# Patient Record
Sex: Female | Born: 1965 | Race: White | Hispanic: No | Marital: Married | Smoking: Former smoker
Health system: Southern US, Community
[De-identification: ages and names within clinical notes are randomized; demographics above are authoritative.]

## PROBLEM LIST (undated history)

## (undated) DIAGNOSIS — G43909 Migraine, unspecified, not intractable, without status migrainosus: Secondary | ICD-10-CM

## (undated) DIAGNOSIS — J45909 Unspecified asthma, uncomplicated: Secondary | ICD-10-CM

## (undated) DIAGNOSIS — R51 Headache: Secondary | ICD-10-CM

## (undated) DIAGNOSIS — E785 Hyperlipidemia, unspecified: Secondary | ICD-10-CM

## (undated) DIAGNOSIS — N390 Urinary tract infection, site not specified: Secondary | ICD-10-CM

## (undated) DIAGNOSIS — D649 Anemia, unspecified: Secondary | ICD-10-CM

## (undated) HISTORY — DX: Migraine, unspecified, not intractable, without status migrainosus: G43.909

## (undated) HISTORY — PX: TONSILLECTOMY: SUR1361

## (undated) HISTORY — DX: Anemia, unspecified: D64.9

---

## 1999-01-07 ENCOUNTER — Other Ambulatory Visit: Admission: RE | Admit: 1999-01-07 | Discharge: 1999-01-07 | Payer: Self-pay | Admitting: Obstetrics and Gynecology

## 1999-08-18 ENCOUNTER — Emergency Department (HOSPITAL_COMMUNITY): Admission: EM | Admit: 1999-08-18 | Discharge: 1999-08-19 | Payer: Self-pay | Admitting: Emergency Medicine

## 1999-11-24 ENCOUNTER — Other Ambulatory Visit: Admission: RE | Admit: 1999-11-24 | Discharge: 1999-11-24 | Payer: Self-pay | Admitting: *Deleted

## 2001-02-01 ENCOUNTER — Other Ambulatory Visit: Admission: RE | Admit: 2001-02-01 | Discharge: 2001-02-01 | Payer: Self-pay | Admitting: *Deleted

## 2001-08-17 ENCOUNTER — Encounter: Payer: Self-pay | Admitting: Obstetrics and Gynecology

## 2001-08-17 ENCOUNTER — Ambulatory Visit (HOSPITAL_COMMUNITY): Admission: RE | Admit: 2001-08-17 | Discharge: 2001-08-17 | Payer: Self-pay | Admitting: Obstetrics and Gynecology

## 2001-11-16 ENCOUNTER — Other Ambulatory Visit: Admission: RE | Admit: 2001-11-16 | Discharge: 2001-11-16 | Payer: Self-pay | Admitting: Gynecology

## 2001-11-27 ENCOUNTER — Inpatient Hospital Stay (HOSPITAL_COMMUNITY): Admission: AD | Admit: 2001-11-27 | Discharge: 2001-11-27 | Payer: Self-pay | Admitting: Family Medicine

## 2002-12-12 ENCOUNTER — Other Ambulatory Visit: Admission: RE | Admit: 2002-12-12 | Discharge: 2002-12-12 | Payer: Self-pay | Admitting: Gynecology

## 2003-06-20 ENCOUNTER — Encounter: Admission: RE | Admit: 2003-06-20 | Discharge: 2003-06-20 | Payer: Self-pay | Admitting: Gynecology

## 2003-12-13 ENCOUNTER — Other Ambulatory Visit: Admission: RE | Admit: 2003-12-13 | Discharge: 2003-12-13 | Payer: Self-pay | Admitting: Gynecology

## 2004-06-20 ENCOUNTER — Encounter: Admission: RE | Admit: 2004-06-20 | Discharge: 2004-06-20 | Payer: Self-pay | Admitting: Gynecology

## 2004-12-29 ENCOUNTER — Other Ambulatory Visit: Admission: RE | Admit: 2004-12-29 | Discharge: 2004-12-29 | Payer: Self-pay | Admitting: Gynecology

## 2005-07-16 ENCOUNTER — Encounter: Admission: RE | Admit: 2005-07-16 | Discharge: 2005-07-16 | Payer: Self-pay | Admitting: Gynecology

## 2006-01-05 ENCOUNTER — Other Ambulatory Visit: Admission: RE | Admit: 2006-01-05 | Discharge: 2006-01-05 | Payer: Self-pay | Admitting: Gynecology

## 2006-07-20 ENCOUNTER — Encounter: Admission: RE | Admit: 2006-07-20 | Discharge: 2006-07-20 | Payer: Self-pay | Admitting: Gynecology

## 2007-05-17 ENCOUNTER — Other Ambulatory Visit: Admission: RE | Admit: 2007-05-17 | Discharge: 2007-05-17 | Payer: Self-pay | Admitting: Gynecology

## 2007-07-21 ENCOUNTER — Encounter: Admission: RE | Admit: 2007-07-21 | Discharge: 2007-07-21 | Payer: Self-pay | Admitting: Gynecology

## 2008-07-24 ENCOUNTER — Encounter: Admission: RE | Admit: 2008-07-24 | Discharge: 2008-07-24 | Payer: Self-pay | Admitting: Family Medicine

## 2009-07-31 ENCOUNTER — Ambulatory Visit (HOSPITAL_COMMUNITY): Admission: RE | Admit: 2009-07-31 | Discharge: 2009-07-31 | Payer: Self-pay | Admitting: Family Medicine

## 2010-07-10 ENCOUNTER — Other Ambulatory Visit (HOSPITAL_COMMUNITY): Payer: Self-pay | Admitting: Family Medicine

## 2010-07-10 ENCOUNTER — Other Ambulatory Visit: Payer: Self-pay | Admitting: Family Medicine

## 2010-07-10 DIAGNOSIS — Z1231 Encounter for screening mammogram for malignant neoplasm of breast: Secondary | ICD-10-CM

## 2010-07-24 ENCOUNTER — Other Ambulatory Visit (HOSPITAL_COMMUNITY): Payer: Self-pay | Admitting: Family Medicine

## 2010-07-24 DIAGNOSIS — Z1231 Encounter for screening mammogram for malignant neoplasm of breast: Secondary | ICD-10-CM

## 2010-08-13 ENCOUNTER — Ambulatory Visit (HOSPITAL_COMMUNITY)
Admission: RE | Admit: 2010-08-13 | Discharge: 2010-08-13 | Disposition: A | Discharge status: Home / Self Care | Payer: 59 | Source: Ambulatory Visit | Attending: Family Medicine | Admitting: Family Medicine

## 2010-08-13 DIAGNOSIS — Z1231 Encounter for screening mammogram for malignant neoplasm of breast: Secondary | ICD-10-CM | POA: Insufficient documentation

## 2011-07-21 ENCOUNTER — Other Ambulatory Visit (HOSPITAL_COMMUNITY): Payer: Self-pay | Admitting: Family Medicine

## 2011-07-21 DIAGNOSIS — Z1231 Encounter for screening mammogram for malignant neoplasm of breast: Secondary | ICD-10-CM

## 2011-09-16 ENCOUNTER — Ambulatory Visit (HOSPITAL_COMMUNITY)
Admission: RE | Admit: 2011-09-16 | Discharge: 2011-09-16 | Disposition: A | Discharge status: Home / Self Care | Payer: 59 | Source: Ambulatory Visit | Attending: Family Medicine | Admitting: Family Medicine

## 2011-09-16 DIAGNOSIS — Z1231 Encounter for screening mammogram for malignant neoplasm of breast: Secondary | ICD-10-CM | POA: Insufficient documentation

## 2012-04-02 ENCOUNTER — Inpatient Hospital Stay (HOSPITAL_COMMUNITY)
Admission: AD | Admit: 2012-04-02 | Discharge: 2012-04-02 | Disposition: A | Discharge status: Home / Self Care | Payer: 59 | Source: Ambulatory Visit | Attending: Obstetrics & Gynecology | Admitting: Obstetrics & Gynecology

## 2012-04-02 ENCOUNTER — Encounter (HOSPITAL_COMMUNITY): Payer: Self-pay | Admitting: *Deleted

## 2012-04-02 DIAGNOSIS — N390 Urinary tract infection, site not specified: Secondary | ICD-10-CM | POA: Insufficient documentation

## 2012-04-02 DIAGNOSIS — R350 Frequency of micturition: Secondary | ICD-10-CM | POA: Insufficient documentation

## 2012-04-02 HISTORY — DX: Hyperlipidemia, unspecified: E78.5

## 2012-04-02 HISTORY — DX: Headache: R51

## 2012-04-02 HISTORY — DX: Urinary tract infection, site not specified: N39.0

## 2012-04-02 LAB — URINALYSIS, ROUTINE W REFLEX MICROSCOPIC
Nitrite: POSITIVE — AB
Specific Gravity, Urine: >1.03 — ABNORMAL HIGH (ref 1.005–1.030)
Urobilinogen, UA: 2 mg/dL — ABNORMAL HIGH (ref 0.0–1.0)

## 2012-04-02 LAB — URINE MICROSCOPIC-ADD ON

## 2012-04-02 MED ORDER — SULFAMETHOXAZOLE-TMP DS 800-160 MG PO TABS: 1.0000 | ORAL_TABLET | Freq: Two times a day (BID) | ORAL | Status: DC

## 2012-04-02 MED ORDER — PHENAZOPYRIDINE HCL 200 MG PO TABS: 200.0000 mg | ORAL_TABLET | Freq: Three times a day (TID) | ORAL | Status: DC | PRN

## 2012-04-02 NOTE — MAU Note (Signed)
Pt reprots she  Is having urine frequency and burning since yesterday. Took some pyruidium and it helped a little with pain.

## 2012-04-02 NOTE — MAU Provider Note (Signed)
History     CSN: [id number]  Arrival date and time: 04/02/12 0749   None     Chief Complaint  Patient presents with  . Urinary Tract Infection   HPI 46 y.o. female with urinary frequency and burning since yesterday. Pyridium helped some with pain.    Past Medical History  Diagnosis Date  . Frequent UTI   . Headache   . Hyperlipidemia, acquired     Past Surgical History  Procedure Date  . Tonsillectomy     No family history on file.  History  Substance Use Topics  . Smoking status: Former Smoker    Quit date: 04/02/2008  . Smokeless tobacco: Not on file  . Alcohol Use: Yes     Comment: moderate    Allergies: No Known Allergies  Prescriptions prior to admission  Medication Sig Dispense Refill  . cholecalciferol (VITAMIN D) 1000 UNITS tablet Take 1,000 Units by mouth daily.      Marland Kitchen levocetirizine (XYZAL) 5 MG tablet Take 5 mg by mouth every evening.      . metoprolol succinate (TOPROL-XL) 50 MG 24 hr tablet Take 50 mg by mouth daily. Take with or immediately following a meal.      . Multiple Vitamins-Minerals (MULTIVITAMIN WITH MINERALS) tablet Take 1 tablet by mouth daily.      . rosuvastatin (CRESTOR) 10 MG tablet Take 10 mg by mouth daily.      . sertraline (ZOLOFT) 100 MG tablet Take 100 mg by mouth daily.      . SUMAtriptan (IMITREX) 100 MG tablet Take 100 mg by mouth every 2 (two) hours as needed. migraines        Review of Systems  Constitutional: Negative.   Respiratory: Negative.   Cardiovascular: Negative.   Gastrointestinal: Positive for abdominal pain. Negative for nausea, vomiting, diarrhea and constipation.  Genitourinary: Positive for dysuria, urgency and frequency. Negative for hematuria and flank pain.       Negative for vaginal bleeding, vaginal discharge, dyspareunia  Musculoskeletal: Negative.   Neurological: Negative.   Psychiatric/Behavioral: Negative.    Physical Exam   Last menstrual period 03/17/2012.  Physical Exam  Nursing  note and vitals reviewed. Constitutional: She is oriented to person, place, and time. She appears well-developed and well-nourished. No distress.  Cardiovascular: Normal rate.   Respiratory: Effort normal.  GI: There is CVA tenderness.  Musculoskeletal: Normal range of motion.  Neurological: She is alert and oriented to person, place, and time.  Skin: Skin is warm and dry.  Psychiatric: She has a normal mood and affect.    MAU Course  Procedures  Results for orders placed during the hospital encounter of 04/02/12 (from the past 24 hour(s))  URINALYSIS, ROUTINE W REFLEX MICROSCOPIC     Status: Abnormal   Collection Time   04/02/12  8:20 AM      Component Value Range   Color, Urine ORANGE (*) YELLOW   APPearance HAZY (*) CLEAR   Specific Gravity, Urine >1.030 (*) 1.005 - 1.030   pH 5.5  5.0 - 8.0   Glucose, UA 100 (*) NEGATIVE mg/dL   Hgb urine dipstick LARGE (*) NEGATIVE   Bilirubin Urine SMALL (*) NEGATIVE   Ketones, ur NEGATIVE  NEGATIVE mg/dL   Protein, ur 30 (*) NEGATIVE mg/dL   Urobilinogen, UA 2.0 (*) 0.0 - 1.0 mg/dL   Nitrite POSITIVE (*) NEGATIVE   Leukocytes, UA TRACE (*) NEGATIVE  URINE MICROSCOPIC-ADD ON     Status: Abnormal   Collection Time  04/02/12  8:20 AM      Component Value Range   Squamous Epithelial / LPF FEW (*) RARE   WBC, UA 3-6  <3 WBC/hpf   RBC / HPF 7-10  <3 RBC/hpf   Bacteria, UA FEW (*) RARE   Crystals CA OXALATE CRYSTALS (*) NEGATIVE   Urine-Other MUCOUS PRESENT       Assessment and Plan   1. UTI (lower urinary tract infection)       Medication List     As of 04/02/2012  9:05 AM    START taking these medications         phenazopyridine 200 MG tablet   Commonly known as: PYRIDIUM   Take 1 tablet (200 mg total) by mouth 3 (three) times daily as needed for pain.      sulfamethoxazole-trimethoprim 800-160 MG per tablet   Commonly known as: BACTRIM DS   Take 1 tablet by mouth 2 (two) times daily.      CONTINUE taking these  medications         cholecalciferol 1000 UNITS tablet   Commonly known as: VITAMIN D      levocetirizine 5 MG tablet   Commonly known as: XYZAL      metoprolol succinate 50 MG 24 hr tablet   Commonly known as: TOPROL-XL      multivitamin with minerals tablet      rosuvastatin 10 MG tablet   Commonly known as: CRESTOR      sertraline 100 MG tablet   Commonly known as: ZOLOFT      SUMAtriptan 100 MG tablet   Commonly known as: IMITREX          Where to get your medications    These are the prescriptions that you need to pick up.   You may get these medications from any pharmacy.         phenazopyridine 200 MG tablet   sulfamethoxazole-trimethoprim 800-160 MG per tablet            Follow-up Information    Follow up with your own doctor. (As needed)           Fenris Cauble 04/02/2012, 9:05 AM

## 2012-04-03 LAB — URINE CULTURE: Colony Count: 10000

## 2012-06-06 ENCOUNTER — Inpatient Hospital Stay (HOSPITAL_COMMUNITY)
Admission: AD | Admit: 2012-06-06 | Discharge: 2012-06-06 | Disposition: A | Discharge status: Home / Self Care | Payer: 59 | Source: Ambulatory Visit | Attending: Obstetrics & Gynecology | Admitting: Obstetrics & Gynecology

## 2012-06-06 ENCOUNTER — Encounter (HOSPITAL_COMMUNITY): Payer: Self-pay

## 2012-06-06 DIAGNOSIS — N390 Urinary tract infection, site not specified: Secondary | ICD-10-CM

## 2012-06-06 DIAGNOSIS — R300 Dysuria: Secondary | ICD-10-CM | POA: Insufficient documentation

## 2012-06-06 LAB — URINE MICROSCOPIC-ADD ON

## 2012-06-06 LAB — URINALYSIS, ROUTINE W REFLEX MICROSCOPIC
Glucose, UA: 100 mg/dL — AB
Ketones, ur: NEGATIVE mg/dL
Protein, ur: NEGATIVE mg/dL

## 2012-06-06 MED ORDER — PHENAZOPYRIDINE HCL 200 MG PO TABS: 200.0000 mg | ORAL_TABLET | Freq: Three times a day (TID) | ORAL | Status: DC

## 2012-06-06 MED ORDER — CIPROFLOXACIN HCL 500 MG PO TABS: 500.0000 mg | ORAL_TABLET | Freq: Two times a day (BID) | ORAL | Status: DC

## 2012-06-06 NOTE — MAU Provider Note (Signed)
Attestation of Attending Supervision of Advanced Practitioner (CNM/NP): Evaluation and management procedures were performed by the Advanced Practitioner under my supervision and collaboration. I have reviewed the Advanced Practitioner's note and chart, and I agree with the management and plan.  Elaynah Virginia H. 4:25 PM   

## 2012-06-06 NOTE — MAU Provider Note (Signed)
History     CSN: [id number]  Arrival date and time: 06/06/12 7829   First Provider Initiated Contact with Patient 06/06/12 [phone]      Chief Complaint  Patient presents with  . Dysuria   HPI Ms. Susan Wagner is a 47 y.o. G0 complaining of dysuria. The patient states that she started to experience pressure and felt like she may void earlier this morning while at work. She has had 3 UTIs since April 2013. The patient denies fever. She is slightly nauseous now, but no vomiting.   OB History    Grav Para Term Preterm Abortions TAB SAB Ect Mult Living   0         0      Past Medical History  Diagnosis Date  . Frequent UTI   . Headache   . Hyperlipidemia, acquired     Past Surgical History  Procedure Date  . Tonsillectomy     No family history on file.  History  Substance Use Topics  . Smoking status: Former Smoker    Quit date: 04/02/2008  . Smokeless tobacco: Not on file  . Alcohol Use: Yes     Comment: moderate    Allergies: No Known Allergies  Prescriptions prior to admission  Medication Sig Dispense Refill  . ergocalciferol (VITAMIN D2) 50000 UNITS capsule Take 50,000 Units by mouth once a week.      . levocetirizine (XYZAL) 5 MG tablet Take 5 mg by mouth every evening.      . metoprolol succinate (TOPROL-XL) 50 MG 24 hr tablet Take 50 mg by mouth daily. Take with or immediately following a meal.      . Multiple Vitamins-Minerals (MULTIVITAMIN WITH MINERALS) tablet Take 1 tablet by mouth daily.      . phenazopyridine (PYRIDIUM) 200 MG tablet Take 1 tablet (200 mg total) by mouth 3 (three) times daily as needed for pain.  10 tablet  0  . rosuvastatin (CRESTOR) 10 MG tablet Take 10 mg by mouth daily.      . sertraline (ZOLOFT) 100 MG tablet Take 100 mg by mouth daily.        ROS All negative unless otherwise noted in HPI Physical Exam   Blood pressure 124/74, pulse 86, temperature 98.3 F (36.8 C), temperature source Oral, resp. rate 16, last menstrual  period 05/04/2012, SpO2 100.00%.  Physical Exam  Constitutional: She is oriented to person, place, and time. She appears well-developed and well-nourished. No distress.  HENT:  Head: Normocephalic.  Cardiovascular: Normal rate, regular rhythm and normal heart sounds.   Respiratory: Effort normal and breath sounds normal. No respiratory distress.  GI: Soft. She exhibits no distension and no mass. There is no tenderness. There is no rebound and no guarding.  Neurological: She is alert and oriented to person, place, and time.  Skin: Skin is warm and dry. No erythema.  Psychiatric: She has a normal mood and affect.   Results for orders placed during the hospital encounter of 06/06/12 (from the past 24 hour(s))  URINALYSIS, ROUTINE W REFLEX MICROSCOPIC     Status: Abnormal   Collection Time   06/06/12  8:50 AM      Component Value Range   Color, Urine AMBER (*) YELLOW   APPearance HAZY (*) CLEAR   Specific Gravity, Urine >1.030 (*) 1.005 - 1.030   pH 5.0  5.0 - 8.0   Glucose, UA 100 (*) NEGATIVE mg/dL   Hgb urine dipstick MODERATE (*) NEGATIVE   Bilirubin  Urine NEGATIVE  NEGATIVE   Ketones, ur NEGATIVE  NEGATIVE mg/dL   Protein, ur NEGATIVE  NEGATIVE mg/dL   Urobilinogen, UA 1.0  0.0 - 1.0 mg/dL   Nitrite POSITIVE (*) NEGATIVE   Leukocytes, UA SMALL (*) NEGATIVE  URINE MICROSCOPIC-ADD ON     Status: Abnormal   Collection Time   06/06/12  8:50 AM      Component Value Range   Squamous Epithelial / LPF FEW (*) RARE   WBC, UA 11-20  <3 WBC/hpf   RBC / HPF 3-6  <3 RBC/hpf   Bacteria, UA FEW (*) RARE   Urine-Other MUCOUS PRESENT    POCT PREGNANCY, URINE     Status: Normal   Collection Time   06/06/12  9:09 AM      Component Value Range   Preg Test, Ur NEGATIVE  NEGATIVE    MAU Course  Procedures None  MDM UA sent  Assessment and Plan  A: UTI  P: Discharge home Rx for cipro and pyridium sent to patient's pharmacy Encouraged adequate hydration Patient should follow-up  with PCP and discuss need for possible urology referral Patient may return to MAU as needed   Freddi Starr, PA-C 06/06/2012, 9:58 AM

## 2012-06-06 NOTE — MAU Note (Signed)
Patient state she has a history or recurrent UTI's. Started having urinary urgency and pressure during the night.

## 2012-06-08 LAB — URINE CULTURE

## 2012-06-30 ENCOUNTER — Other Ambulatory Visit (HOSPITAL_COMMUNITY): Payer: Self-pay | Admitting: Family Medicine

## 2012-09-16 ENCOUNTER — Ambulatory Visit (HOSPITAL_COMMUNITY)
Admission: RE | Admit: 2012-09-16 | Discharge: 2012-09-16 | Disposition: A | Discharge status: Home / Self Care | Payer: 59 | Source: Ambulatory Visit | Attending: Family Medicine | Admitting: Family Medicine

## 2012-09-16 DIAGNOSIS — Z1231 Encounter for screening mammogram for malignant neoplasm of breast: Secondary | ICD-10-CM | POA: Insufficient documentation

## 2012-11-22 ENCOUNTER — Encounter: Payer: Self-pay | Admitting: Obstetrics and Gynecology

## 2012-11-23 ENCOUNTER — Ambulatory Visit: Payer: Self-pay | Admitting: Obstetrics and Gynecology

## 2012-11-23 ENCOUNTER — Encounter: Payer: Self-pay | Admitting: Obstetrics and Gynecology

## 2012-11-23 DIAGNOSIS — Z01419 Encounter for gynecological examination (general) (routine) without abnormal findings: Secondary | ICD-10-CM

## 2013-03-16 ENCOUNTER — Other Ambulatory Visit: Payer: Self-pay

## 2013-07-27 ENCOUNTER — Ambulatory Visit: Payer: Self-pay | Admitting: Women's Health

## 2013-08-15 ENCOUNTER — Other Ambulatory Visit (HOSPITAL_COMMUNITY): Payer: Self-pay | Admitting: Family Medicine

## 2013-08-15 DIAGNOSIS — Z1231 Encounter for screening mammogram for malignant neoplasm of breast: Secondary | ICD-10-CM

## 2013-08-25 ENCOUNTER — Ambulatory Visit: Payer: Self-pay | Admitting: Women's Health

## 2013-09-05 ENCOUNTER — Ambulatory Visit (INDEPENDENT_AMBULATORY_CARE_PROVIDER_SITE_OTHER): Payer: 59 | Admitting: Women's Health

## 2013-09-05 ENCOUNTER — Encounter: Payer: Self-pay | Admitting: Women's Health

## 2013-09-05 ENCOUNTER — Other Ambulatory Visit (HOSPITAL_COMMUNITY)
Admission: RE | Admit: 2013-09-05 | Discharge: 2013-09-05 | Disposition: A | Discharge status: Home / Self Care | Payer: 59 | Source: Ambulatory Visit | Attending: Women's Health | Admitting: Women's Health

## 2013-09-05 VITALS — BP 110/78 | Ht 64.0 in | Wt 176.0 lb

## 2013-09-05 DIAGNOSIS — Z1151 Encounter for screening for human papillomavirus (HPV): Secondary | ICD-10-CM | POA: Insufficient documentation

## 2013-09-05 DIAGNOSIS — N979 Female infertility, unspecified: Secondary | ICD-10-CM | POA: Insufficient documentation

## 2013-09-05 DIAGNOSIS — N920 Excessive and frequent menstruation with regular cycle: Secondary | ICD-10-CM

## 2013-09-05 DIAGNOSIS — Z01419 Encounter for gynecological examination (general) (routine) without abnormal findings: Secondary | ICD-10-CM | POA: Insufficient documentation

## 2013-09-05 NOTE — Progress Notes (Signed)
Susan Wagner 1966/02/16 [id number]    History:    Presents for new patient annual exam.  Monthly cycle/infertility, cycles lasting up to 2 weeks. Prior patient of Dr Carren Rang. Reports  normal Paps and mammograms. Hypercholesterolemia and migraines managed by  primary care.   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 Sister cervical cancer.  ROS:  A  ROS was performed and pertinent positives and negatives are included.  Exam:  Filed Vitals:   09/05/13 1519  BP: 110/78    General appearance:  Normal Thyroid:  Symmetrical, normal in size, without palpable masses or nodularity. Respiratory  Auscultation:  Clear without wheezing or rhonchi Cardiovascular  Auscultation:  Regular rate, without rubs, murmurs or gallops  Edema/varicosities:  Not grossly evident Abdominal  Soft,nontender, without masses, guarding or rebound.  Liver/spleen:  No organomegaly noted  Hernia:  None appreciated  Skin  Inspection:  Grossly normal   Breasts: Examined lying and sitting.     Right: Without masses, retractions, discharge or axillary adenopathy.     Left: Without masses, retractions, discharge or axillary adenopathy. Gentitourinary   Inguinal/mons:  Normal without inguinal adenopathy  External genitalia:  Normal  BUS/Urethra/Skene's glands:  Normal  Vagina:  Normal  Cervix:  Normal  Uterus:  normal in size, shape and contour.  Midline and mobile  Adnexa/parametria:     Rt: Without masses or tenderness.   Lt: Without masses or tenderness.  Anus and perineum: Normal  Digital rectal exam: Normal sphincter tone without palpated masses or tenderness  Assessment/Plan:  47 y.o.MWF G0  for annual exam.   Primary infertility Menorrhagia Hypercholesteremia and migraines -  meds and labs primary care  Plan: Sonohysterogram with Dr. Phineas Real after next cycle.  Options reviewed, ablation. SBE's, continue annual mammogram, increase regular exercise and decrease calories for weight loss, vitamin D 2000 daily encouraged. TSH, prolactin, UA, with HR HPV typing.   [place], 5:35 PM 09/05/2013

## 2013-09-05 NOTE — Patient Instructions (Signed)
Menorrhagia  Menorrhagia is the medical term for when your menstrual periods are heavy or last longer than usual. With menorrhagia, every period you have may cause enough blood loss and cramping that you are unable to maintain your usual activities.  CAUSES   In some cases, the cause of heavy periods is unknown, but a number of conditions may cause menorrhagia. Common causes include:  · A problem with the hormone-producing thyroid gland (hypothyroid).  · Noncancerous growths in the uterus (polyps or fibroids).  · An imbalance of the estrogen and progesterone hormones.  · One of your ovaries not releasing an egg during one or more months.  · Side effects of having an intrauterine device (IUD).  · Side effects of some medicines, such as anti-inflammatory medicines or blood thinners.  · A bleeding disorder that stops your blood from clotting normally.  SIGNS AND SYMPTOMS   During a normal period, bleeding lasts between 4 and 8 days. Signs that your periods are too heavy include:  · You routinely have to change your pad or tampon every 1 or 2 hours because it is completely soaked.  · You pass blood clots larger than 1 inch (2.5 cm) in size.  · You have bleeding for more than 7 days.  · You need to use pads and tampons at the same time because of heavy bleeding.  · You need to wake up to change your pads or tampons during the night.  · You have symptoms of anemia, such as tiredness, fatigue, or shortness of breath.   DIAGNOSIS   Your health care provider will perform a physical exam and ask you questions about your symptoms and menstrual history. Other tests may be ordered based on what the health care provider finds during the exam. These tests can include:  · Blood tests To check if you are pregnant or have hormonal changes, a bleeding or thyroid disorder, low iron levels (anemia), or other problems.  · Endometrial biopsy Your health care provider takes a sample of tissue from the inside of your uterus to be examined  under a microscope.  · Pelvic ultrasound This test uses sound waves to make a picture of your uterus, ovaries, and vagina. The pictures can show if you have fibroids or other growths.  · Hysteroscopy For this test, your health care provider will use a small telescope to look inside your uterus.  Based on the results of your initial tests, your health care provider may recommend further testing.  TREATMENT   Treatment may not be needed. If it is needed, your health care provider may recommend treatment with one or more medicines first. If these do not reduce bleeding enough, a surgical treatment might be an option. The best treatment for you will depend on:   · Whether you need to prevent pregnancy.    · Your desire to have children in the future.  · The cause and severity of your bleeding.  · Your opinion and personal preference.    Medicines for menorrhagia may include:  · Birth control methods that use hormones These include the pill, skin patch, vaginal ring, shots that you get every 3 months, hormonal IUD, and implant. These treatments reduce bleeding during your menstrual period.  · Medicines that thicken blood and slow bleeding.  · Medicines that reduce swelling, such as ibuprofen.   · Medicines that contain a synthetic hormone called progestin.    · Medicines that make the ovaries stop working for a short time.    You may need surgical   treatment for menorrhagia if the medicines are unsuccessful. Treatment options include:  · Dilation and curettage (D&C) In this procedure, your health care provider opens (dilates) your cervix and then scrapes or suctions tissue from the lining of your uterus to reduce menstrual bleeding.  · Operative hysteroscopy This procedure uses a tiny tube with a light (hysteroscope) to view your uterine cavity and can help in the surgical removal of a polyp that may be causing heavy periods.  · Endometrial ablation Through various techniques, your health care provider permanently  destroys the entire lining of your uterus (endometrium). After endometrial ablation, most women have little or no menstrual flow. Endometrial ablation reduces your ability to become pregnant.  · Endometrial resection This surgical procedure uses an electrosurgical wire loop to remove the lining of the uterus. This procedure also reduces your ability to become pregnant.  · Hysterectomy Surgical removal of the uterus and cervix is a permanent procedure that stops menstrual periods. Pregnancy is not possible after a hysterectomy. This procedure requires anesthesia and hospitalization.  HOME CARE INSTRUCTIONS   · Only take over-the-counter or prescription medicines as directed by your health care provider. Take prescribed medicines exactly as directed. Do not change or switch medicines without consulting your health care provider.  · Take any prescribed iron pills exactly as directed by your health care provider. Long-term heavy bleeding may result in low iron levels. Iron pills help replace the iron your body lost from heavy bleeding. Iron may cause constipation. If this becomes a problem, increase the bran, fruits, and roughage in your diet.  · Do not take aspirin or medicines that contain aspirin 1 week before or during your menstrual period. Aspirin may make the bleeding worse.  · If you need to change your sanitary pad or tampon more than once every 2 hours, stay in bed and rest as much as possible until the bleeding stops.  · Eat well-balanced meals. Eat foods high in iron. Examples are leafy green vegetables, meat, liver, eggs, and whole grain breads and cereals. Do not try to lose weight until the abnormal bleeding has stopped and your blood iron level is back to normal.  SEEK MEDICAL CARE IF:   · You soak through a pad or tampon every 1 or 2 hours, and this happens every time you have a period.  · You need to use pads and tampons at the same time because you are bleeding so much.  · You need to change your pad  or tampon during the night.  · You have a period that lasts for more than 8 days.  · You pass clots bigger than 1 inch wide.  · You have irregular periods that happen more or less often than once a month.  · You feel dizzy or faint.  · You feel very weak or tired.  · You feel short of breath or feel your heart is beating too fast when you exercise.  · You have nausea and vomiting or diarrhea while you are taking your medicine.  · You have any problems that may be related to the medicine you are taking.  SEEK IMMEDIATE MEDICAL CARE IF:   · You soak through 4 or more pads or tampons in 2 hours.  · You have any bleeding while you are pregnant.  MAKE SURE YOU:   · Understand these instructions.  · Will watch your condition.  · Will get help right away if you are not doing well or get worse.    Document Released: 04/27/2005 Document Revised: 02/15/2013 Document Reviewed: 10/16/2012  [employer]® Patient Information ©2014 [employer].

## 2013-09-06 ENCOUNTER — Encounter: Payer: Self-pay | Admitting: Women's Health

## 2013-09-06 LAB — URINALYSIS W MICROSCOPIC + REFLEX CULTURE
Bilirubin Urine: NEGATIVE
Casts: NONE SEEN
Crystals: NONE SEEN
Glucose, UA: NEGATIVE mg/dL
KETONES UR: NEGATIVE mg/dL
Nitrite: NEGATIVE
PROTEIN: NEGATIVE mg/dL
Specific Gravity, Urine: 1.025 (ref 1.005–1.030)
UROBILINOGEN UA: 0.2 mg/dL (ref 0.0–1.0)
WBC, UA: >50 WBC/hpf — AB (ref <3)
pH: 5.5 (ref 5.0–8.0)

## 2013-09-06 LAB — TSH: TSH: 1.748 u[IU]/mL (ref 0.350–4.500)

## 2013-09-06 LAB — PROLACTIN: Prolactin: 12.7 ng/mL

## 2013-09-08 ENCOUNTER — Other Ambulatory Visit: Payer: Self-pay | Admitting: Women's Health

## 2013-09-08 DIAGNOSIS — N390 Urinary tract infection, site not specified: Secondary | ICD-10-CM

## 2013-09-08 LAB — URINE CULTURE: Colony Count: >=100000

## 2013-09-26 ENCOUNTER — Other Ambulatory Visit: Payer: 59

## 2013-09-26 ENCOUNTER — Ambulatory Visit (HOSPITAL_COMMUNITY)
Admission: RE | Admit: 2013-09-26 | Discharge: 2013-09-26 | Disposition: A | Discharge status: Home / Self Care | Payer: 59 | Source: Ambulatory Visit | Attending: Family Medicine | Admitting: Family Medicine

## 2013-09-26 DIAGNOSIS — Z1231 Encounter for screening mammogram for malignant neoplasm of breast: Secondary | ICD-10-CM | POA: Insufficient documentation

## 2013-09-26 DIAGNOSIS — N390 Urinary tract infection, site not specified: Secondary | ICD-10-CM

## 2013-09-27 ENCOUNTER — Encounter: Payer: Self-pay | Admitting: Women's Health

## 2013-09-27 LAB — URINALYSIS W MICROSCOPIC + REFLEX CULTURE
Bilirubin Urine: NEGATIVE
CASTS: NONE SEEN
Glucose, UA: NEGATIVE mg/dL
HGB URINE DIPSTICK: NEGATIVE
KETONES UR: NEGATIVE mg/dL
Leukocytes, UA: NEGATIVE
Nitrite: NEGATIVE
PH: 5.5 (ref 5.0–8.0)
Protein, ur: NEGATIVE mg/dL
Specific Gravity, Urine: 1.017 (ref 1.005–1.030)
UROBILINOGEN UA: 0.2 mg/dL (ref 0.0–1.0)

## 2014-08-28 ENCOUNTER — Other Ambulatory Visit (HOSPITAL_COMMUNITY): Payer: Self-pay | Admitting: Family Medicine

## 2014-08-28 DIAGNOSIS — Z1231 Encounter for screening mammogram for malignant neoplasm of breast: Secondary | ICD-10-CM

## 2014-10-03 ENCOUNTER — Ambulatory Visit (HOSPITAL_COMMUNITY)
Admission: RE | Admit: 2014-10-03 | Discharge: 2014-10-03 | Disposition: A | Discharge status: Home / Self Care | Payer: 59 | Source: Ambulatory Visit | Attending: Family Medicine | Admitting: Family Medicine

## 2014-10-03 DIAGNOSIS — Z1231 Encounter for screening mammogram for malignant neoplasm of breast: Secondary | ICD-10-CM | POA: Insufficient documentation

## 2014-11-14 ENCOUNTER — Ambulatory Visit (INDEPENDENT_AMBULATORY_CARE_PROVIDER_SITE_OTHER): Payer: 59 | Admitting: Women's Health

## 2014-11-14 ENCOUNTER — Encounter: Payer: Self-pay | Admitting: Women's Health

## 2014-11-14 VITALS — BP 130/82 | Wt 178.0 lb

## 2014-11-14 DIAGNOSIS — N951 Menopausal and female climacteric states: Secondary | ICD-10-CM

## 2014-11-14 DIAGNOSIS — N912 Amenorrhea, unspecified: Secondary | ICD-10-CM | POA: Diagnosis not present

## 2014-11-14 DIAGNOSIS — Z01419 Encounter for gynecological examination (general) (routine) without abnormal findings: Secondary | ICD-10-CM | POA: Diagnosis not present

## 2014-11-14 MED ORDER — VENLAFAXINE HCL ER 75 MG PO CP24
75.0000 mg | ORAL_CAPSULE | Freq: Every day | ORAL | Status: DC
Start: 2014-11-14 — End: 2017-02-16

## 2014-11-14 NOTE — Progress Notes (Signed)
Susan Wagner 1966/04/14 [id number]    History:    Presents for annual exam.  Regular monthly cycle until September 2015. Increased hot flushes difficulty sleeping since. Normal Pap and mammogram history. Primary care manages hypercholesterolemia and migraines. History of infertility.  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  ROS:  A ROS was performed and pertinent positives and negatives are included.  Exam:  Filed Vitals:   11/14/14 1411  BP: 130/82    General appearance:  Normal Thyroid:  Symmetrical, normal in size, without palpable masses or nodularity. Respiratory  Auscultation:  Clear without wheezing or rhonchi Cardiovascular  Auscultation:  Regular rate, without rubs, murmurs or gallops  Edema/varicosities:  Not grossly evident Abdominal  Soft,nontender, without masses, guarding or rebound.  Liver/spleen:  No organomegaly noted  Hernia:  None appreciated  Skin  Inspection:  Grossly normal   Breasts: Examined lying and sitting.     Right: Without masses, retractions, discharge or axillary adenopathy.     Left: Without masses, retractions, discharge or axillary adenopathy. Gentitourinary   Inguinal/mons:  Normal without inguinal adenopathy  External genitalia:  Normal  BUS/Urethra/Skene's glands:  Normal  Vagina:  Normal  Cervix:  Normal  Uterus:   normal in size, shape and contour.  Midline and mobile  Adnexa/parametria:     Rt: Without masses or tenderness.   Lt: Without masses or tenderness.  Anus and perineum: Normal  Digital rectal exam: Normal sphincter tone without palpated masses or tenderness  Assessment/Plan:  49 y.o. MWF G0 for annual exam.    Amenorrhea with hot flashes and difficulty sleeping Hypercholesterolemia-primary care manages labs and meds  Plan: Menopause reviewed, options discussed, HRT,  declines will try Effexor 75 mg daily prescription, proper use given and reviewed will call if no relief of symptoms. SBE's, continue annual 3-D screening mammogram, calcium rich diet, vitamin D 2000 daily -reports normal vitamin D at primary care. Increase exercise and decrease calories for weight loss encouraged. FSH, UA. Pap normal with negative HR HPV 2015, new screening guidelines reviewed. [facility] information given instructed to schedule screening colonoscopy at 72.    Huel Cote [facility], 5:51 PM 11/14/2014

## 2014-11-14 NOTE — Patient Instructions (Signed)
Colonoscopy  [facility] Dr Raquel James or Dr Carlean Purl  Health Recommendations for Postmenopausal Women Respected and ongoing research has looked at the most common causes of death, disability, and poor quality of life in postmenopausal women. The causes include heart disease, diseases of blood vessels, diabetes, depression, cancer, and bone loss (osteoporosis). Many things can be done to help lower the chances of developing these and other common problems. CARDIOVASCULAR DISEASE Heart Disease: A heart attack is a medical emergency. Know the signs and symptoms of a heart attack. Below are things women can do to reduce their risk for heart disease.   Do not smoke. If you smoke, quit.  Aim for a healthy weight. Being overweight causes many preventable deaths. Eat a healthy and balanced diet and drink an adequate amount of liquids.  Get moving. Make a commitment to be more physically active. Aim for 30 minutes of activity on most, if not all days of the week.  Eat for heart health. Choose a diet that is low in saturated fat and cholesterol and eliminate trans fat. Include whole grains, vegetables, and fruits. Read and understand the labels on food containers before buying.  Know your numbers. Ask your caregiver to check your blood pressure, cholesterol (total, HDL, LDL, triglycerides) and blood glucose. Work with your caregiver on improving your entire clinical picture.  High blood pressure. Limit or stop your table salt intake (try salt substitute and food seasonings). Avoid salty foods and drinks. Read labels on food containers before buying. Eating well and exercising can help control high blood pressure. STROKE  Stroke is a medical emergency. Stroke may be the result of a blood clot in a blood vessel in the brain or by a brain hemorrhage (bleeding). Know the signs and symptoms of a stroke. To lower the risk of developing a stroke:  Avoid fatty foods.  Quit smoking.  Control your diabetes, blood  pressure, and irregular heart rate. THROMBOPHLEBITIS (BLOOD CLOT) OF THE LEG  Becoming overweight and leading a stationary lifestyle may also contribute to developing blood clots. Controlling your diet and exercising will help lower the risk of developing blood clots. CANCER SCREENING  Breast Cancer: Take steps to reduce your risk of breast cancer.  You should practice "breast self-awareness." This means understanding the normal appearance and feel of your breasts and should include breast self-examination. Any changes detected, no matter how small, should be reported to your caregiver.  After age 46, you should have a clinical breast exam (CBE) every year.  Starting at age 44, you should consider having a mammogram (breast X-ray) every year.  If you have a family history of breast cancer, talk to your caregiver about genetic screening.  If you are at high risk for breast cancer, talk to your caregiver about having an MRI and a mammogram every year.  Intestinal or Stomach Cancer: Tests to consider are a rectal exam, fecal occult blood, sigmoidoscopy, and colonoscopy. Women who are high risk may need to be screened at an earlier age and more often.  Cervical Cancer:  Beginning at age 72, you should have a Pap test every 3 years as long as the past 3 Pap tests have been normal.  If you have had past treatment for cervical cancer or a condition that could lead to cancer, you need Pap tests and screening for cancer for at least 20 years after your treatment.  If you had a hysterectomy for a problem that was not cancer or a condition that could lead  to cancer, then you no longer need Pap tests.  If you are between ages 44 and 32, and you have had normal Pap tests going back 10 years, you no longer need Pap tests.  If Pap tests have been discontinued, risk factors (such as a new sexual partner) need to be reassessed to determine if screening should be resumed.  Some medical problems can  increase the chance of getting cervical cancer. In these cases, your caregiver may recommend more frequent screening and Pap tests.  Uterine Cancer: If you have vaginal bleeding after reaching menopause, you should notify your caregiver.  Ovarian Cancer: Other than yearly pelvic exams, there are no reliable tests available to screen for ovarian cancer at this time except for yearly pelvic exams.  Lung Cancer: Yearly chest X-rays can detect lung cancer and should be done on high risk women, such as cigarette smokers and women with chronic lung disease (emphysema).  Skin Cancer: A complete body skin exam should be done at your yearly examination. Avoid overexposure to the sun and ultraviolet light lamps. Use a strong sun block cream when in the sun. All of these things are important for lowering the risk of skin cancer. MENOPAUSE Menopause Symptoms: Hormone therapy products are effective for treating symptoms associated with menopause:  Moderate to severe hot flashes.  Night sweats.  Mood swings.  Headaches.  Tiredness.  Loss of sex drive.  Insomnia.  Other symptoms. Hormone replacement carries certain risks, especially in older women. Women who use or are thinking about using estrogen or estrogen with progestin treatments should discuss that with their caregiver. Your caregiver will help you understand the benefits and risks. The ideal dose of hormone replacement therapy is not known. The Food and Drug Administration (FDA) has concluded that hormone therapy should be used only at the lowest doses and for the shortest amount of time to reach treatment goals.  OSTEOPOROSIS Protecting Against Bone Loss and Preventing Fracture If you use hormone therapy for prevention of bone loss (osteoporosis), the risks for bone loss must outweigh the risk of the therapy. Ask your caregiver about other medications known to be safe and effective for preventing bone loss and fractures. To guard against bone  loss or fractures, the following is recommended:  If you are younger than age 71, take 1000 mg of calcium and at least 600 mg of Vitamin D per day.  If you are older than age 66 but younger than age 29, take 1200 mg of calcium and at least 600 mg of Vitamin D per day.  If you are older than age 46, take 1200 mg of calcium and at least 800 mg of Vitamin D per day. Smoking and excessive alcohol intake increases the risk of osteoporosis. Eat foods rich in calcium and vitamin D and do weight bearing exercises several times a week as your caregiver suggests. DIABETES Diabetes Mellitus: If you have type I or type 2 diabetes, you should keep your blood sugar under control with diet, exercise, and recommended medication. Avoid starchy and fatty foods, and too many sweets. Being overweight can make diabetes control more difficult. COGNITION AND MEMORY Cognition and Memory: Menopausal hormone therapy is not recommended for the prevention of cognitive disorders such as Alzheimer's disease or memory loss.  DEPRESSION  Depression may occur at any age, but it is common in elderly women. This may be because of physical, medical, social (loneliness), or financial problems and needs. If you are experiencing depression because of medical problems and  control of symptoms, talk to your caregiver about this. Physical activity and exercise may help with mood and sleep. Community and volunteer involvement may improve your sense of value and worth. If you have depression and you feel that the problem is getting worse or becoming severe, talk to your caregiver about which treatment options are best for you. ACCIDENTS  Accidents are common and can be serious in elderly woman. Prepare your house to prevent accidents. Eliminate throw rugs, place hand bars in bath, shower, and toilet areas. Avoid wearing high heeled shoes or walking on wet, snowy, and icy areas. Limit or stop driving if you have vision or hearing problems, or if  you feel you are unsteady with your movements and reflexes. HEPATITIS C Hepatitis C is a type of viral infection affecting the liver. It is spread mainly through contact with blood from an infected person. It can be treated, but if left untreated, it can lead to severe liver damage over the years. Many people who are infected do not know that the virus is in their blood. If you are a "baby-boomer", it is recommended that you have one screening test for Hepatitis C. IMMUNIZATIONS  Several immunizations are important to consider having during your senior years, including:   Tetanus, diphtheria, and pertussis booster shot.  Influenza every year before the flu season begins.  Pneumonia vaccine.  Shingles vaccine.  Others, as indicated based on your specific needs. Talk to your caregiver about these. Document Released: 06/19/2005 Document Revised: 09/11/2013 Document Reviewed: 02/13/2008 [facility] Patient Information 2015 [place], [place]. This information is not intended to replace advice given to you by your health care provider. Make sure you discuss any questions you have with your health care provider.

## 2014-11-15 LAB — URINALYSIS W MICROSCOPIC + REFLEX CULTURE
Bacteria, UA: NONE SEEN
CASTS: NONE SEEN
Crystals: NONE SEEN
GLUCOSE, UA: NEGATIVE mg/dL
Ketones, ur: NEGATIVE mg/dL
LEUKOCYTES UA: NEGATIVE
Nitrite: NEGATIVE
PH: 5 (ref 5.0–8.0)
Protein, ur: NEGATIVE mg/dL
Specific Gravity, Urine: 1.02 (ref 1.005–1.030)
Urobilinogen, UA: 0.2 mg/dL (ref 0.0–1.0)

## 2014-11-20 ENCOUNTER — Encounter: Payer: Self-pay | Admitting: Women's Health

## 2015-05-17 DIAGNOSIS — J301 Allergic rhinitis due to pollen: Secondary | ICD-10-CM | POA: Diagnosis not present

## 2015-05-17 DIAGNOSIS — J3081 Allergic rhinitis due to animal (cat) (dog) hair and dander: Secondary | ICD-10-CM | POA: Diagnosis not present

## 2015-05-17 DIAGNOSIS — J3089 Other allergic rhinitis: Secondary | ICD-10-CM | POA: Diagnosis not present

## 2015-05-30 DIAGNOSIS — J3081 Allergic rhinitis due to animal (cat) (dog) hair and dander: Secondary | ICD-10-CM | POA: Diagnosis not present

## 2015-05-30 DIAGNOSIS — J3089 Other allergic rhinitis: Secondary | ICD-10-CM | POA: Diagnosis not present

## 2015-05-30 DIAGNOSIS — J301 Allergic rhinitis due to pollen: Secondary | ICD-10-CM | POA: Diagnosis not present

## 2015-06-25 DIAGNOSIS — J3081 Allergic rhinitis due to animal (cat) (dog) hair and dander: Secondary | ICD-10-CM | POA: Diagnosis not present

## 2015-06-25 DIAGNOSIS — J301 Allergic rhinitis due to pollen: Secondary | ICD-10-CM | POA: Diagnosis not present

## 2015-06-25 DIAGNOSIS — J3089 Other allergic rhinitis: Secondary | ICD-10-CM | POA: Diagnosis not present

## 2015-07-05 DIAGNOSIS — J301 Allergic rhinitis due to pollen: Secondary | ICD-10-CM | POA: Diagnosis not present

## 2015-07-12 MED FILL — TOPIRAMATE 50 MG TABLET: 50 | 90 days supply | Qty: 90 | Fill #1

## 2015-07-12 MED FILL — LEVOCETIRIZINE 5 MG TABLET: 5 | 90 days supply | Qty: 90 | Fill #1

## 2015-07-16 DIAGNOSIS — J3089 Other allergic rhinitis: Secondary | ICD-10-CM | POA: Diagnosis not present

## 2015-07-16 DIAGNOSIS — J3081 Allergic rhinitis due to animal (cat) (dog) hair and dander: Secondary | ICD-10-CM | POA: Diagnosis not present

## 2015-07-16 DIAGNOSIS — J301 Allergic rhinitis due to pollen: Secondary | ICD-10-CM | POA: Diagnosis not present

## 2015-07-31 MED FILL — SERTRALINE HCL 100 MG TAB: 100 | 90 days supply | Qty: 90 | Fill #0

## 2015-08-19 DIAGNOSIS — J3081 Allergic rhinitis due to animal (cat) (dog) hair and dander: Secondary | ICD-10-CM | POA: Diagnosis not present

## 2015-08-19 DIAGNOSIS — J3089 Other allergic rhinitis: Secondary | ICD-10-CM | POA: Diagnosis not present

## 2015-08-19 DIAGNOSIS — J301 Allergic rhinitis due to pollen: Secondary | ICD-10-CM | POA: Diagnosis not present

## 2015-08-26 DIAGNOSIS — J301 Allergic rhinitis due to pollen: Secondary | ICD-10-CM | POA: Diagnosis not present

## 2015-08-26 DIAGNOSIS — J3089 Other allergic rhinitis: Secondary | ICD-10-CM | POA: Diagnosis not present

## 2015-08-26 DIAGNOSIS — J3081 Allergic rhinitis due to animal (cat) (dog) hair and dander: Secondary | ICD-10-CM | POA: Diagnosis not present

## 2015-08-30 MED FILL — ROSUVASTATIN CALCIUM 10 MG: 10 | 90 days supply | Qty: 90 | Fill #0

## 2015-09-04 ENCOUNTER — Other Ambulatory Visit: Payer: Self-pay

## 2015-09-04 DIAGNOSIS — Z1231 Encounter for screening mammogram for malignant neoplasm of breast: Secondary | ICD-10-CM

## 2015-09-05 DIAGNOSIS — J3089 Other allergic rhinitis: Secondary | ICD-10-CM | POA: Diagnosis not present

## 2015-09-05 DIAGNOSIS — J301 Allergic rhinitis due to pollen: Secondary | ICD-10-CM | POA: Diagnosis not present

## 2015-09-05 DIAGNOSIS — J3081 Allergic rhinitis due to animal (cat) (dog) hair and dander: Secondary | ICD-10-CM | POA: Diagnosis not present

## 2015-09-16 DIAGNOSIS — J3081 Allergic rhinitis due to animal (cat) (dog) hair and dander: Secondary | ICD-10-CM | POA: Diagnosis not present

## 2015-09-16 DIAGNOSIS — J301 Allergic rhinitis due to pollen: Secondary | ICD-10-CM | POA: Diagnosis not present

## 2015-09-16 DIAGNOSIS — J3089 Other allergic rhinitis: Secondary | ICD-10-CM | POA: Diagnosis not present

## 2015-10-08 ENCOUNTER — Ambulatory Visit: Payer: 59

## 2015-10-21 ENCOUNTER — Ambulatory Visit
Admission: RE | Admit: 2015-10-21 | Discharge: 2015-10-21 | Disposition: A | Discharge status: Home / Self Care | Payer: 59 | Source: Ambulatory Visit

## 2015-10-21 DIAGNOSIS — Z1231 Encounter for screening mammogram for malignant neoplasm of breast: Secondary | ICD-10-CM | POA: Diagnosis not present

## 2015-10-21 DIAGNOSIS — J3089 Other allergic rhinitis: Secondary | ICD-10-CM | POA: Diagnosis not present

## 2015-10-21 DIAGNOSIS — J3081 Allergic rhinitis due to animal (cat) (dog) hair and dander: Secondary | ICD-10-CM | POA: Diagnosis not present

## 2015-10-21 DIAGNOSIS — J301 Allergic rhinitis due to pollen: Secondary | ICD-10-CM | POA: Diagnosis not present

## 2015-10-23 ENCOUNTER — Encounter: Payer: Self-pay | Admitting: Women's Health

## 2015-10-25 MED FILL — TOPIRAMATE 50 MG TABLET: 50 | 90 days supply | Qty: 90 | Fill #0

## 2015-10-28 DIAGNOSIS — J3081 Allergic rhinitis due to animal (cat) (dog) hair and dander: Secondary | ICD-10-CM | POA: Diagnosis not present

## 2015-10-28 DIAGNOSIS — J3089 Other allergic rhinitis: Secondary | ICD-10-CM | POA: Diagnosis not present

## 2015-10-28 DIAGNOSIS — J301 Allergic rhinitis due to pollen: Secondary | ICD-10-CM | POA: Diagnosis not present

## 2015-10-30 DIAGNOSIS — J3089 Other allergic rhinitis: Secondary | ICD-10-CM | POA: Diagnosis not present

## 2015-10-30 DIAGNOSIS — J3081 Allergic rhinitis due to animal (cat) (dog) hair and dander: Secondary | ICD-10-CM | POA: Diagnosis not present

## 2015-11-04 DIAGNOSIS — J301 Allergic rhinitis due to pollen: Secondary | ICD-10-CM | POA: Diagnosis not present

## 2015-11-04 DIAGNOSIS — J3089 Other allergic rhinitis: Secondary | ICD-10-CM | POA: Diagnosis not present

## 2015-11-04 DIAGNOSIS — J3081 Allergic rhinitis due to animal (cat) (dog) hair and dander: Secondary | ICD-10-CM | POA: Diagnosis not present

## 2015-11-13 MED FILL — SERTRALINE HCL 100 MG TAB: 100 | 90 days supply | Qty: 90 | Fill #1

## 2015-11-14 DIAGNOSIS — J3081 Allergic rhinitis due to animal (cat) (dog) hair and dander: Secondary | ICD-10-CM | POA: Diagnosis not present

## 2015-11-14 DIAGNOSIS — J3089 Other allergic rhinitis: Secondary | ICD-10-CM | POA: Diagnosis not present

## 2015-11-14 DIAGNOSIS — J301 Allergic rhinitis due to pollen: Secondary | ICD-10-CM | POA: Diagnosis not present

## 2015-11-15 ENCOUNTER — Encounter: Payer: 59 | Admitting: Women's Health

## 2015-11-26 ENCOUNTER — Encounter: Payer: 59 | Admitting: Women's Health

## 2015-11-26 DIAGNOSIS — J3081 Allergic rhinitis due to animal (cat) (dog) hair and dander: Secondary | ICD-10-CM | POA: Diagnosis not present

## 2015-11-26 DIAGNOSIS — J301 Allergic rhinitis due to pollen: Secondary | ICD-10-CM | POA: Diagnosis not present

## 2015-11-26 DIAGNOSIS — J3089 Other allergic rhinitis: Secondary | ICD-10-CM | POA: Diagnosis not present

## 2015-12-02 DIAGNOSIS — J3081 Allergic rhinitis due to animal (cat) (dog) hair and dander: Secondary | ICD-10-CM | POA: Diagnosis not present

## 2015-12-02 DIAGNOSIS — J3089 Other allergic rhinitis: Secondary | ICD-10-CM | POA: Diagnosis not present

## 2015-12-02 DIAGNOSIS — J301 Allergic rhinitis due to pollen: Secondary | ICD-10-CM | POA: Diagnosis not present

## 2015-12-10 DIAGNOSIS — J301 Allergic rhinitis due to pollen: Secondary | ICD-10-CM | POA: Diagnosis not present

## 2015-12-10 DIAGNOSIS — J3089 Other allergic rhinitis: Secondary | ICD-10-CM | POA: Diagnosis not present

## 2015-12-10 DIAGNOSIS — J3081 Allergic rhinitis due to animal (cat) (dog) hair and dander: Secondary | ICD-10-CM | POA: Diagnosis not present

## 2015-12-14 ENCOUNTER — Telehealth: Payer: 59 | Admitting: Family

## 2015-12-14 DIAGNOSIS — J019 Acute sinusitis, unspecified: Secondary | ICD-10-CM | POA: Diagnosis not present

## 2015-12-14 MED ORDER — AMOXICILLIN-POT CLAVULANATE 875-125 MG PO TABS: 1.0000 | ORAL_TABLET | Freq: Two times a day (BID) | ORAL | 0 refills | Status: DC

## 2015-12-14 NOTE — Progress Notes (Signed)

## 2015-12-16 DIAGNOSIS — J3081 Allergic rhinitis due to animal (cat) (dog) hair and dander: Secondary | ICD-10-CM | POA: Diagnosis not present

## 2015-12-16 DIAGNOSIS — J3089 Other allergic rhinitis: Secondary | ICD-10-CM | POA: Diagnosis not present

## 2015-12-16 DIAGNOSIS — J301 Allergic rhinitis due to pollen: Secondary | ICD-10-CM | POA: Diagnosis not present

## 2015-12-16 MED FILL — ROSUVASTATIN CALCIUM 10 MG: 10 | 90 days supply | Qty: 90 | Fill #0

## 2015-12-31 DIAGNOSIS — F329 Major depressive disorder, single episode, unspecified: Secondary | ICD-10-CM | POA: Diagnosis not present

## 2015-12-31 DIAGNOSIS — J3081 Allergic rhinitis due to animal (cat) (dog) hair and dander: Secondary | ICD-10-CM | POA: Diagnosis not present

## 2015-12-31 DIAGNOSIS — G43109 Migraine with aura, not intractable, without status migrainosus: Secondary | ICD-10-CM | POA: Diagnosis not present

## 2015-12-31 DIAGNOSIS — N3021 Other chronic cystitis with hematuria: Secondary | ICD-10-CM | POA: Diagnosis not present

## 2015-12-31 DIAGNOSIS — E7800 Pure hypercholesterolemia, unspecified: Secondary | ICD-10-CM | POA: Diagnosis not present

## 2015-12-31 DIAGNOSIS — J3089 Other allergic rhinitis: Secondary | ICD-10-CM | POA: Diagnosis not present

## 2015-12-31 DIAGNOSIS — J301 Allergic rhinitis due to pollen: Secondary | ICD-10-CM | POA: Diagnosis not present

## 2016-01-06 DIAGNOSIS — J301 Allergic rhinitis due to pollen: Secondary | ICD-10-CM | POA: Diagnosis not present

## 2016-01-06 DIAGNOSIS — J3089 Other allergic rhinitis: Secondary | ICD-10-CM | POA: Diagnosis not present

## 2016-01-06 DIAGNOSIS — J3081 Allergic rhinitis due to animal (cat) (dog) hair and dander: Secondary | ICD-10-CM | POA: Diagnosis not present

## 2016-01-08 DIAGNOSIS — H52223 Regular astigmatism, bilateral: Secondary | ICD-10-CM | POA: Diagnosis not present

## 2016-01-08 DIAGNOSIS — H5213 Myopia, bilateral: Secondary | ICD-10-CM | POA: Diagnosis not present

## 2016-01-08 DIAGNOSIS — H524 Presbyopia: Secondary | ICD-10-CM | POA: Diagnosis not present

## 2016-01-14 DIAGNOSIS — J301 Allergic rhinitis due to pollen: Secondary | ICD-10-CM | POA: Diagnosis not present

## 2016-01-14 DIAGNOSIS — J3081 Allergic rhinitis due to animal (cat) (dog) hair and dander: Secondary | ICD-10-CM | POA: Diagnosis not present

## 2016-01-14 DIAGNOSIS — J3089 Other allergic rhinitis: Secondary | ICD-10-CM | POA: Diagnosis not present

## 2016-02-05 DIAGNOSIS — J301 Allergic rhinitis due to pollen: Secondary | ICD-10-CM | POA: Diagnosis not present

## 2016-02-05 DIAGNOSIS — J3081 Allergic rhinitis due to animal (cat) (dog) hair and dander: Secondary | ICD-10-CM | POA: Diagnosis not present

## 2016-02-05 DIAGNOSIS — J4520 Mild intermittent asthma, uncomplicated: Secondary | ICD-10-CM | POA: Diagnosis not present

## 2016-02-05 DIAGNOSIS — J3089 Other allergic rhinitis: Secondary | ICD-10-CM | POA: Diagnosis not present

## 2016-02-05 MED FILL — LEVOCETIRIZINE 5 MG TABLET: 5 | 30 days supply | Qty: 30 | Fill #0

## 2016-02-07 MED FILL — TOPIRAMATE 50 MG TABLET: 50 | 90 days supply | Qty: 90 | Fill #0

## 2016-02-10 DIAGNOSIS — J3089 Other allergic rhinitis: Secondary | ICD-10-CM | POA: Diagnosis not present

## 2016-02-10 DIAGNOSIS — J301 Allergic rhinitis due to pollen: Secondary | ICD-10-CM | POA: Diagnosis not present

## 2016-02-10 DIAGNOSIS — J3081 Allergic rhinitis due to animal (cat) (dog) hair and dander: Secondary | ICD-10-CM | POA: Diagnosis not present

## 2016-02-17 DIAGNOSIS — J301 Allergic rhinitis due to pollen: Secondary | ICD-10-CM | POA: Diagnosis not present

## 2016-02-17 DIAGNOSIS — J3081 Allergic rhinitis due to animal (cat) (dog) hair and dander: Secondary | ICD-10-CM | POA: Diagnosis not present

## 2016-02-17 DIAGNOSIS — J3089 Other allergic rhinitis: Secondary | ICD-10-CM | POA: Diagnosis not present

## 2016-02-25 DIAGNOSIS — J3089 Other allergic rhinitis: Secondary | ICD-10-CM | POA: Diagnosis not present

## 2016-02-25 DIAGNOSIS — J3081 Allergic rhinitis due to animal (cat) (dog) hair and dander: Secondary | ICD-10-CM | POA: Diagnosis not present

## 2016-02-25 DIAGNOSIS — J301 Allergic rhinitis due to pollen: Secondary | ICD-10-CM | POA: Diagnosis not present

## 2016-03-02 MED FILL — SERTRALINE HCL 100 MG TAB: 100 | 90 days supply | Qty: 90 | Fill #0

## 2016-03-11 DIAGNOSIS — J3081 Allergic rhinitis due to animal (cat) (dog) hair and dander: Secondary | ICD-10-CM | POA: Diagnosis not present

## 2016-03-11 DIAGNOSIS — J301 Allergic rhinitis due to pollen: Secondary | ICD-10-CM | POA: Diagnosis not present

## 2016-03-11 DIAGNOSIS — J3089 Other allergic rhinitis: Secondary | ICD-10-CM | POA: Diagnosis not present

## 2016-03-11 MED FILL — LEVOCETIRIZINE 5 MG TABLET: 5 | 30 days supply | Qty: 30 | Fill #1

## 2016-03-23 DIAGNOSIS — J301 Allergic rhinitis due to pollen: Secondary | ICD-10-CM | POA: Diagnosis not present

## 2016-03-23 DIAGNOSIS — J3089 Other allergic rhinitis: Secondary | ICD-10-CM | POA: Diagnosis not present

## 2016-03-23 DIAGNOSIS — J3081 Allergic rhinitis due to animal (cat) (dog) hair and dander: Secondary | ICD-10-CM | POA: Diagnosis not present

## 2016-04-08 MED FILL — ROSUVASTATIN CALCIUM 10 MG: 10 | 90 days supply | Qty: 90 | Fill #0

## 2016-04-13 DIAGNOSIS — J3089 Other allergic rhinitis: Secondary | ICD-10-CM | POA: Diagnosis not present

## 2016-04-13 DIAGNOSIS — J3081 Allergic rhinitis due to animal (cat) (dog) hair and dander: Secondary | ICD-10-CM | POA: Diagnosis not present

## 2016-04-13 DIAGNOSIS — J301 Allergic rhinitis due to pollen: Secondary | ICD-10-CM | POA: Diagnosis not present

## 2016-05-15 ENCOUNTER — Telehealth: Payer: 59 | Admitting: Family

## 2016-05-15 DIAGNOSIS — B3731 Acute candidiasis of vulva and vagina: Secondary | ICD-10-CM

## 2016-05-15 DIAGNOSIS — B373 Candidiasis of vulva and vagina: Secondary | ICD-10-CM | POA: Diagnosis not present

## 2016-05-15 MED ORDER — FLUCONAZOLE 150 MG PO TABS: 150.0000 mg | ORAL_TABLET | Freq: Once | ORAL | 0 refills | Status: AC

## 2016-05-15 MED FILL — TOPIRAMATE 50 MG TABLET: 50 | 90 days supply | Qty: 90 | Fill #1

## 2016-05-15 NOTE — Progress Notes (Signed)

## 2016-05-18 MED FILL — LEVOCETIRIZINE 5 MG TABLET: 5 | 90 days supply | Qty: 90 | Fill #2

## 2016-05-19 DIAGNOSIS — J3089 Other allergic rhinitis: Secondary | ICD-10-CM | POA: Diagnosis not present

## 2016-05-19 DIAGNOSIS — J3081 Allergic rhinitis due to animal (cat) (dog) hair and dander: Secondary | ICD-10-CM | POA: Diagnosis not present

## 2016-05-19 DIAGNOSIS — J301 Allergic rhinitis due to pollen: Secondary | ICD-10-CM | POA: Diagnosis not present

## 2016-05-26 DIAGNOSIS — J3081 Allergic rhinitis due to animal (cat) (dog) hair and dander: Secondary | ICD-10-CM | POA: Diagnosis not present

## 2016-05-26 DIAGNOSIS — J301 Allergic rhinitis due to pollen: Secondary | ICD-10-CM | POA: Diagnosis not present

## 2016-05-26 DIAGNOSIS — J3089 Other allergic rhinitis: Secondary | ICD-10-CM | POA: Diagnosis not present

## 2016-05-26 MED FILL — EPINEPHRINE 0.3 MG AUTO-INJ: 0.3 | 30 days supply | Qty: 2 | Fill #0

## 2016-06-03 MED FILL — SERTRALINE HCL 100 MG TAB: 100 | 90 days supply | Qty: 90 | Fill #0

## 2016-06-09 DIAGNOSIS — J3081 Allergic rhinitis due to animal (cat) (dog) hair and dander: Secondary | ICD-10-CM | POA: Diagnosis not present

## 2016-06-09 DIAGNOSIS — J301 Allergic rhinitis due to pollen: Secondary | ICD-10-CM | POA: Diagnosis not present

## 2016-06-09 DIAGNOSIS — J3089 Other allergic rhinitis: Secondary | ICD-10-CM | POA: Diagnosis not present

## 2016-06-29 DIAGNOSIS — J301 Allergic rhinitis due to pollen: Secondary | ICD-10-CM | POA: Diagnosis not present

## 2016-06-29 DIAGNOSIS — J3089 Other allergic rhinitis: Secondary | ICD-10-CM | POA: Diagnosis not present

## 2016-06-29 DIAGNOSIS — J3081 Allergic rhinitis due to animal (cat) (dog) hair and dander: Secondary | ICD-10-CM | POA: Diagnosis not present

## 2016-07-07 DIAGNOSIS — J3089 Other allergic rhinitis: Secondary | ICD-10-CM | POA: Diagnosis not present

## 2016-07-07 DIAGNOSIS — J3081 Allergic rhinitis due to animal (cat) (dog) hair and dander: Secondary | ICD-10-CM | POA: Diagnosis not present

## 2016-07-07 DIAGNOSIS — J301 Allergic rhinitis due to pollen: Secondary | ICD-10-CM | POA: Diagnosis not present

## 2016-07-08 DIAGNOSIS — E782 Mixed hyperlipidemia: Secondary | ICD-10-CM | POA: Diagnosis not present

## 2016-07-08 DIAGNOSIS — Z0000 Encounter for general adult medical examination without abnormal findings: Secondary | ICD-10-CM | POA: Diagnosis not present

## 2016-07-08 DIAGNOSIS — R3129 Other microscopic hematuria: Secondary | ICD-10-CM | POA: Diagnosis not present

## 2016-07-08 DIAGNOSIS — J3089 Other allergic rhinitis: Secondary | ICD-10-CM | POA: Diagnosis not present

## 2016-07-08 DIAGNOSIS — Z1211 Encounter for screening for malignant neoplasm of colon: Secondary | ICD-10-CM | POA: Diagnosis not present

## 2016-07-08 DIAGNOSIS — R8290 Unspecified abnormal findings in urine: Secondary | ICD-10-CM | POA: Diagnosis not present

## 2016-07-08 DIAGNOSIS — F419 Anxiety disorder, unspecified: Secondary | ICD-10-CM | POA: Diagnosis not present

## 2016-07-08 DIAGNOSIS — Z8669 Personal history of other diseases of the nervous system and sense organs: Secondary | ICD-10-CM | POA: Diagnosis not present

## 2016-07-09 DIAGNOSIS — J301 Allergic rhinitis due to pollen: Secondary | ICD-10-CM | POA: Diagnosis not present

## 2016-07-13 DIAGNOSIS — J301 Allergic rhinitis due to pollen: Secondary | ICD-10-CM | POA: Diagnosis not present

## 2016-07-13 DIAGNOSIS — J3089 Other allergic rhinitis: Secondary | ICD-10-CM | POA: Diagnosis not present

## 2016-07-13 DIAGNOSIS — J3081 Allergic rhinitis due to animal (cat) (dog) hair and dander: Secondary | ICD-10-CM | POA: Diagnosis not present

## 2016-07-23 MED FILL — ROSUVASTATIN CALCIUM 10 MG: 10 | 90 days supply | Qty: 90 | Fill #1

## 2016-07-27 DIAGNOSIS — J301 Allergic rhinitis due to pollen: Secondary | ICD-10-CM | POA: Diagnosis not present

## 2016-07-27 DIAGNOSIS — J3089 Other allergic rhinitis: Secondary | ICD-10-CM | POA: Diagnosis not present

## 2016-07-27 DIAGNOSIS — J3081 Allergic rhinitis due to animal (cat) (dog) hair and dander: Secondary | ICD-10-CM | POA: Diagnosis not present

## 2016-08-03 DIAGNOSIS — J3081 Allergic rhinitis due to animal (cat) (dog) hair and dander: Secondary | ICD-10-CM | POA: Diagnosis not present

## 2016-08-03 DIAGNOSIS — J3089 Other allergic rhinitis: Secondary | ICD-10-CM | POA: Diagnosis not present

## 2016-08-03 DIAGNOSIS — J301 Allergic rhinitis due to pollen: Secondary | ICD-10-CM | POA: Diagnosis not present

## 2016-08-20 DIAGNOSIS — J3081 Allergic rhinitis due to animal (cat) (dog) hair and dander: Secondary | ICD-10-CM | POA: Diagnosis not present

## 2016-08-20 DIAGNOSIS — J3089 Other allergic rhinitis: Secondary | ICD-10-CM | POA: Diagnosis not present

## 2016-08-20 DIAGNOSIS — J301 Allergic rhinitis due to pollen: Secondary | ICD-10-CM | POA: Diagnosis not present

## 2016-08-24 MED FILL — TOPIRAMATE 50 MG TABLET: 50 | 90 days supply | Qty: 90 | Fill #0

## 2016-08-24 MED FILL — LEVOCETIRIZINE 5 MG TABLET: 5 | 60 days supply | Qty: 60 | Fill #3

## 2016-08-28 DIAGNOSIS — J3089 Other allergic rhinitis: Secondary | ICD-10-CM | POA: Diagnosis not present

## 2016-08-28 DIAGNOSIS — Z1211 Encounter for screening for malignant neoplasm of colon: Secondary | ICD-10-CM | POA: Diagnosis not present

## 2016-08-28 DIAGNOSIS — F419 Anxiety disorder, unspecified: Secondary | ICD-10-CM | POA: Diagnosis not present

## 2016-08-28 DIAGNOSIS — R8290 Unspecified abnormal findings in urine: Secondary | ICD-10-CM | POA: Diagnosis not present

## 2016-08-28 DIAGNOSIS — R3129 Other microscopic hematuria: Secondary | ICD-10-CM | POA: Diagnosis not present

## 2016-08-28 DIAGNOSIS — E782 Mixed hyperlipidemia: Secondary | ICD-10-CM | POA: Diagnosis not present

## 2016-08-28 DIAGNOSIS — Z0000 Encounter for general adult medical examination without abnormal findings: Secondary | ICD-10-CM | POA: Diagnosis not present

## 2016-08-28 DIAGNOSIS — Z8669 Personal history of other diseases of the nervous system and sense organs: Secondary | ICD-10-CM | POA: Diagnosis not present

## 2016-08-28 DIAGNOSIS — J3081 Allergic rhinitis due to animal (cat) (dog) hair and dander: Secondary | ICD-10-CM | POA: Diagnosis not present

## 2016-08-28 DIAGNOSIS — J301 Allergic rhinitis due to pollen: Secondary | ICD-10-CM | POA: Diagnosis not present

## 2016-09-08 DIAGNOSIS — J3081 Allergic rhinitis due to animal (cat) (dog) hair and dander: Secondary | ICD-10-CM | POA: Diagnosis not present

## 2016-09-08 DIAGNOSIS — J3089 Other allergic rhinitis: Secondary | ICD-10-CM | POA: Diagnosis not present

## 2016-09-08 DIAGNOSIS — J301 Allergic rhinitis due to pollen: Secondary | ICD-10-CM | POA: Diagnosis not present

## 2016-09-11 ENCOUNTER — Other Ambulatory Visit: Payer: Self-pay | Admitting: Family Medicine

## 2016-09-11 DIAGNOSIS — Z1231 Encounter for screening mammogram for malignant neoplasm of breast: Secondary | ICD-10-CM

## 2016-09-15 DIAGNOSIS — J3081 Allergic rhinitis due to animal (cat) (dog) hair and dander: Secondary | ICD-10-CM | POA: Diagnosis not present

## 2016-09-15 DIAGNOSIS — J3089 Other allergic rhinitis: Secondary | ICD-10-CM | POA: Diagnosis not present

## 2016-09-15 DIAGNOSIS — J301 Allergic rhinitis due to pollen: Secondary | ICD-10-CM | POA: Diagnosis not present

## 2016-09-17 DIAGNOSIS — J3089 Other allergic rhinitis: Secondary | ICD-10-CM | POA: Diagnosis not present

## 2016-09-17 DIAGNOSIS — J3081 Allergic rhinitis due to animal (cat) (dog) hair and dander: Secondary | ICD-10-CM | POA: Diagnosis not present

## 2016-09-17 MED FILL — SERTRALINE HCL 100 MG TAB: 100 | 90 days supply | Qty: 90 | Fill #0

## 2016-09-21 DIAGNOSIS — J3081 Allergic rhinitis due to animal (cat) (dog) hair and dander: Secondary | ICD-10-CM | POA: Diagnosis not present

## 2016-09-21 DIAGNOSIS — J301 Allergic rhinitis due to pollen: Secondary | ICD-10-CM | POA: Diagnosis not present

## 2016-09-21 DIAGNOSIS — J3089 Other allergic rhinitis: Secondary | ICD-10-CM | POA: Diagnosis not present

## 2016-09-24 DIAGNOSIS — J301 Allergic rhinitis due to pollen: Secondary | ICD-10-CM | POA: Diagnosis not present

## 2016-09-24 DIAGNOSIS — J3089 Other allergic rhinitis: Secondary | ICD-10-CM | POA: Diagnosis not present

## 2016-09-28 DIAGNOSIS — J3089 Other allergic rhinitis: Secondary | ICD-10-CM | POA: Diagnosis not present

## 2016-09-28 DIAGNOSIS — J301 Allergic rhinitis due to pollen: Secondary | ICD-10-CM | POA: Diagnosis not present

## 2016-09-28 DIAGNOSIS — J3081 Allergic rhinitis due to animal (cat) (dog) hair and dander: Secondary | ICD-10-CM | POA: Diagnosis not present

## 2016-10-06 DIAGNOSIS — J3089 Other allergic rhinitis: Secondary | ICD-10-CM | POA: Diagnosis not present

## 2016-10-06 DIAGNOSIS — J3081 Allergic rhinitis due to animal (cat) (dog) hair and dander: Secondary | ICD-10-CM | POA: Diagnosis not present

## 2016-10-06 DIAGNOSIS — J301 Allergic rhinitis due to pollen: Secondary | ICD-10-CM | POA: Diagnosis not present

## 2016-10-15 DIAGNOSIS — J3081 Allergic rhinitis due to animal (cat) (dog) hair and dander: Secondary | ICD-10-CM | POA: Diagnosis not present

## 2016-10-15 DIAGNOSIS — J3089 Other allergic rhinitis: Secondary | ICD-10-CM | POA: Diagnosis not present

## 2016-10-15 DIAGNOSIS — J301 Allergic rhinitis due to pollen: Secondary | ICD-10-CM | POA: Diagnosis not present

## 2016-10-20 DIAGNOSIS — J301 Allergic rhinitis due to pollen: Secondary | ICD-10-CM | POA: Diagnosis not present

## 2016-10-20 DIAGNOSIS — J3089 Other allergic rhinitis: Secondary | ICD-10-CM | POA: Diagnosis not present

## 2016-10-20 DIAGNOSIS — J3081 Allergic rhinitis due to animal (cat) (dog) hair and dander: Secondary | ICD-10-CM | POA: Diagnosis not present

## 2016-10-26 ENCOUNTER — Ambulatory Visit
Admission: RE | Admit: 2016-10-26 | Discharge: 2016-10-26 | Disposition: A | Discharge status: Home / Self Care | Payer: 59 | Source: Ambulatory Visit | Attending: Family Medicine | Admitting: Family Medicine

## 2016-10-26 DIAGNOSIS — J3081 Allergic rhinitis due to animal (cat) (dog) hair and dander: Secondary | ICD-10-CM | POA: Diagnosis not present

## 2016-10-26 DIAGNOSIS — Z1231 Encounter for screening mammogram for malignant neoplasm of breast: Secondary | ICD-10-CM | POA: Diagnosis not present

## 2016-10-26 DIAGNOSIS — J301 Allergic rhinitis due to pollen: Secondary | ICD-10-CM | POA: Diagnosis not present

## 2016-10-26 DIAGNOSIS — J3089 Other allergic rhinitis: Secondary | ICD-10-CM | POA: Diagnosis not present

## 2016-10-27 ENCOUNTER — Encounter: Payer: Self-pay | Admitting: Women's Health

## 2016-10-28 MED FILL — ROSUVASTATIN CALCIUM 10 MG: 10 | 90 days supply | Qty: 90 | Fill #0

## 2016-10-30 MED FILL — LEVOCETIRIZINE 5 MG TABLET: 5 | 90 days supply | Qty: 90 | Fill #0

## 2016-11-03 DIAGNOSIS — J301 Allergic rhinitis due to pollen: Secondary | ICD-10-CM | POA: Diagnosis not present

## 2016-11-03 DIAGNOSIS — J3089 Other allergic rhinitis: Secondary | ICD-10-CM | POA: Diagnosis not present

## 2016-11-03 DIAGNOSIS — J3081 Allergic rhinitis due to animal (cat) (dog) hair and dander: Secondary | ICD-10-CM | POA: Diagnosis not present

## 2016-11-16 ENCOUNTER — Encounter: Payer: 59 | Admitting: Women's Health

## 2016-11-16 DIAGNOSIS — J3081 Allergic rhinitis due to animal (cat) (dog) hair and dander: Secondary | ICD-10-CM | POA: Diagnosis not present

## 2016-11-16 DIAGNOSIS — J3089 Other allergic rhinitis: Secondary | ICD-10-CM | POA: Diagnosis not present

## 2016-11-16 DIAGNOSIS — J301 Allergic rhinitis due to pollen: Secondary | ICD-10-CM | POA: Diagnosis not present

## 2016-11-17 ENCOUNTER — Encounter: Payer: 59 | Admitting: Women's Health

## 2016-11-30 DIAGNOSIS — J3081 Allergic rhinitis due to animal (cat) (dog) hair and dander: Secondary | ICD-10-CM | POA: Diagnosis not present

## 2016-11-30 DIAGNOSIS — J3089 Other allergic rhinitis: Secondary | ICD-10-CM | POA: Diagnosis not present

## 2016-11-30 DIAGNOSIS — J301 Allergic rhinitis due to pollen: Secondary | ICD-10-CM | POA: Diagnosis not present

## 2016-12-08 MED FILL — TOPIRAMATE 50 MG TABLET: 50 | 90 days supply | Qty: 90 | Fill #1

## 2016-12-14 DIAGNOSIS — J3081 Allergic rhinitis due to animal (cat) (dog) hair and dander: Secondary | ICD-10-CM | POA: Diagnosis not present

## 2016-12-14 DIAGNOSIS — J3089 Other allergic rhinitis: Secondary | ICD-10-CM | POA: Diagnosis not present

## 2016-12-14 DIAGNOSIS — J301 Allergic rhinitis due to pollen: Secondary | ICD-10-CM | POA: Diagnosis not present

## 2016-12-22 DIAGNOSIS — J3089 Other allergic rhinitis: Secondary | ICD-10-CM | POA: Diagnosis not present

## 2016-12-22 DIAGNOSIS — J3081 Allergic rhinitis due to animal (cat) (dog) hair and dander: Secondary | ICD-10-CM | POA: Diagnosis not present

## 2016-12-22 DIAGNOSIS — J301 Allergic rhinitis due to pollen: Secondary | ICD-10-CM | POA: Diagnosis not present

## 2017-01-06 DIAGNOSIS — J301 Allergic rhinitis due to pollen: Secondary | ICD-10-CM | POA: Diagnosis not present

## 2017-01-06 DIAGNOSIS — J3089 Other allergic rhinitis: Secondary | ICD-10-CM | POA: Diagnosis not present

## 2017-01-06 DIAGNOSIS — J3081 Allergic rhinitis due to animal (cat) (dog) hair and dander: Secondary | ICD-10-CM | POA: Diagnosis not present

## 2017-01-08 MED FILL — SERTRALINE HCL 100 MG TAB: 100 | 90 days supply | Qty: 90 | Fill #1

## 2017-01-11 DIAGNOSIS — M7582 Other shoulder lesions, left shoulder: Secondary | ICD-10-CM | POA: Diagnosis not present

## 2017-01-19 ENCOUNTER — Encounter: Payer: 59 | Attending: Family Medicine | Admitting: Skilled Nursing Facility1

## 2017-01-19 ENCOUNTER — Encounter: Payer: Self-pay | Admitting: Skilled Nursing Facility1

## 2017-01-19 DIAGNOSIS — Z713 Dietary counseling and surveillance: Secondary | ICD-10-CM | POA: Insufficient documentation

## 2017-01-19 DIAGNOSIS — J3089 Other allergic rhinitis: Secondary | ICD-10-CM | POA: Diagnosis not present

## 2017-01-19 DIAGNOSIS — E639 Nutritional deficiency, unspecified: Secondary | ICD-10-CM

## 2017-01-19 DIAGNOSIS — J3081 Allergic rhinitis due to animal (cat) (dog) hair and dander: Secondary | ICD-10-CM | POA: Diagnosis not present

## 2017-01-19 DIAGNOSIS — J301 Allergic rhinitis due to pollen: Secondary | ICD-10-CM | POA: Diagnosis not present

## 2017-01-19 NOTE — Patient Instructions (Addendum)
-  Different pasta brands taste different: look for more fiber on the label   -Take your 30 minute break and have a meal: [place] sandwich with spinach, avacado and mayo and onion baked chips or cherries or carrot sticks or pear or banana          Frozen meal with a fruit or more vegetable or yogurt (9 or less grams of sugar and fat)    Peanut butter jelly sandwich    -Do not go longer than 5 hours with out eating    -Switch to diet soda   -Try a peanut butter banana sandwich for first meal or tomato sandwich with cheese on the side or bagel sandwich with egg, cheese, spinach, onion   -Aim for 2 of your cups of water

## 2017-01-19 NOTE — Progress Notes (Signed)
  Medical Nutrition Therapy:  Appt start time: 8:06 end time:  0900.   Assessment:  Primary concerns today: self referral. Pt states her husband was diagnosed with diabetes recently and attended the Core classes with her husband and really enjoyed them. Pt states she works Third shift. Pt states she is okay with change, just have never thought about it before.  MEDICATIONS: See List   DIETARY INTAKE:  Usual eating pattern includes 2 meals and 1 snacks per day.  Everyday foods include none stated.  Avoided foods include none stated.    24-hr recall: some fruit throughout the week  B ( AM): skipped---mcd[employer] cheeseburger and fries (at night)----off work: omelot with cheese and bacon Snk ( AM):  L ( PM): cheesburger----skipped  Snk ( PM): chips  D ( PM): (off work) Radiation protection practitioner or steak with corn on the cob and potato salad Snk ( PM):  Beverages: iced coffee, sweet tea, coke, coffee, juice, unsweet tea, water  Usual physical activity: walking 1 mile 3 days a week  Estimated energy needs: 1500 calories 170 g carbohydrates 112 g protein 42 g fat  Progress Towards Goal(s):  In progress.   Nutritional Diagnosis:  NB-1.1 Food and nutrition-related knowledge deficit As related to no prior nutrition education from a nutrition professional.  As evidenced by meal skipping and over consumption of fast food.    Intervention:  Nutrition counseling for healthy eating. Goals: -Different pasta brands taste different: look for more fiber on the label  -Take your 30 minute break and have a meal: [place] sandwich with spinach, avacado and mayo and onion baked chips or cherries or carrot sticks or pear or banana          Frozen meal with a fruit or more vegetable or yogurt (9 or less grams of sugar and fat)   Peanut butter jelly sandwich  -Do not go longer than 5 hours with out eating  -Switch to diet soda  -Try a peanut butter banana sandwich for first meal or tomato sandwich with cheese on  the side or bagel sandwich with egg, cheese, spinach, onion  -Aim for 2 of your cups of water  Teaching Method Utilized:  Visual Auditory Hands on  Handouts given during visit include:  Yellow card  Barriers to learning/adherence to lifestyle change: none identified   Demonstrated degree of understanding via:  Teach Back   Monitoring/Evaluation:  Dietary intake, exercise, and body weight prn.

## 2017-02-03 DIAGNOSIS — J3081 Allergic rhinitis due to animal (cat) (dog) hair and dander: Secondary | ICD-10-CM | POA: Diagnosis not present

## 2017-02-03 DIAGNOSIS — J3089 Other allergic rhinitis: Secondary | ICD-10-CM | POA: Diagnosis not present

## 2017-02-03 DIAGNOSIS — J301 Allergic rhinitis due to pollen: Secondary | ICD-10-CM | POA: Diagnosis not present

## 2017-02-03 DIAGNOSIS — J4520 Mild intermittent asthma, uncomplicated: Secondary | ICD-10-CM | POA: Diagnosis not present

## 2017-02-09 DIAGNOSIS — J3081 Allergic rhinitis due to animal (cat) (dog) hair and dander: Secondary | ICD-10-CM | POA: Diagnosis not present

## 2017-02-09 DIAGNOSIS — J3089 Other allergic rhinitis: Secondary | ICD-10-CM | POA: Diagnosis not present

## 2017-02-09 DIAGNOSIS — J301 Allergic rhinitis due to pollen: Secondary | ICD-10-CM | POA: Diagnosis not present

## 2017-02-12 MED FILL — ROSUVASTATIN CALCIUM 10 MG: 10 | 90 days supply | Qty: 90 | Fill #0

## 2017-02-12 MED FILL — LEVOCETIRIZINE 5 MG TABLET: 5 | 90 days supply | Qty: 90 | Fill #1

## 2017-02-16 ENCOUNTER — Ambulatory Visit (INDEPENDENT_AMBULATORY_CARE_PROVIDER_SITE_OTHER): Payer: 59 | Admitting: Women's Health

## 2017-02-16 ENCOUNTER — Encounter: Payer: Self-pay | Admitting: Women's Health

## 2017-02-16 VITALS — BP 112/72 | Ht 65.0 in | Wt 180.0 lb

## 2017-02-16 DIAGNOSIS — I10 Essential (primary) hypertension: Secondary | ICD-10-CM | POA: Diagnosis not present

## 2017-02-16 DIAGNOSIS — Z01419 Encounter for gynecological examination (general) (routine) without abnormal findings: Secondary | ICD-10-CM | POA: Diagnosis not present

## 2017-02-16 DIAGNOSIS — Z1151 Encounter for screening for human papillomavirus (HPV): Secondary | ICD-10-CM

## 2017-02-16 NOTE — Patient Instructions (Signed)
Colonoscopy  [phone] Dr Carlean Purl  Health Maintenance for Postmenopausal Women Menopause is a normal process in which your reproductive ability comes to an end. This process happens gradually over a span of months to years, usually between the ages of 37 and 70. Menopause is complete when you have missed 12 consecutive menstrual periods. It is important to talk with your health care provider about some of the most common conditions that affect postmenopausal women, such as heart disease, cancer, and bone loss (osteoporosis). Adopting a healthy lifestyle and getting preventive care can help to promote your health and wellness. Those actions can also lower your chances of developing some of these common conditions. What should I know about menopause? During menopause, you may experience a number of symptoms, such as:  Moderate-to-severe hot flashes.  Night sweats.  Decrease in sex drive.  Mood swings.  Headaches.  Tiredness.  Irritability.  Memory problems.  Insomnia.  Choosing to treat or not to treat menopausal changes is an individual decision that you make with your health care provider. What should I know about hormone replacement therapy and supplements? Hormone therapy products are effective for treating symptoms that are associated with menopause, such as hot flashes and night sweats. Hormone replacement carries certain risks, especially as you become older. If you are thinking about using estrogen or estrogen with progestin treatments, discuss the benefits and risks with your health care provider. What should I know about heart disease and stroke? Heart disease, heart attack, and stroke become more likely as you age. This may be due, in part, to the hormonal changes that your body experiences during menopause. These can affect how your body processes dietary fats, triglycerides, and cholesterol. Heart attack and stroke are both medical emergencies. There are many things that you  can do to help prevent heart disease and stroke:  Have your blood pressure checked at least every 1-2 years. High blood pressure causes heart disease and increases the risk of stroke.  If you are 47-9 years old, ask your health care provider if you should take aspirin to prevent a heart attack or a stroke.  Do not use any tobacco products, including cigarettes, chewing tobacco, or electronic cigarettes. If you need help quitting, ask your health care provider.  It is important to eat a healthy diet and maintain a healthy weight. ? Be sure to include plenty of vegetables, fruits, low-fat dairy products, and lean protein. ? Avoid eating foods that are high in solid fats, added sugars, or salt (sodium).  Get regular exercise. This is one of the most important things that you can do for your health. ? Try to exercise for at least 150 minutes each week. The type of exercise that you do should increase your heart rate and make you sweat. This is known as moderate-intensity exercise. ? Try to do strengthening exercises at least twice each week. Do these in addition to the moderate-intensity exercise.  Know your numbers.Ask your health care provider to check your cholesterol and your blood glucose. Continue to have your blood tested as directed by your health care provider.  What should I know about cancer screening? There are several types of cancer. Take the following steps to reduce your risk and to catch any cancer development as early as possible. Breast Cancer  Practice breast self-awareness. ? This means understanding how your breasts normally appear and feel. ? It also means doing regular breast self-exams. Let your health care provider know about any changes, no matter how  small.  If you are 40 or older, have a clinician do a breast exam (clinical breast exam or CBE) every year. Depending on your age, family history, and medical history, it may be recommended that you also have a yearly  breast X-ray (mammogram).  If you have a family history of breast cancer, talk with your health care provider about genetic screening.  If you are at high risk for breast cancer, talk with your health care provider about having an MRI and a mammogram every year.  Breast cancer (BRCA) gene test is recommended for women who have family members with BRCA-related cancers. Results of the assessment will determine the need for genetic counseling and BRCA1 and for BRCA2 testing. BRCA-related cancers include these types: ? Breast. This occurs in males or females. ? Ovarian. ? Tubal. This may also be called fallopian tube cancer. ? Cancer of the abdominal or pelvic lining (peritoneal cancer). ? Prostate. ? Pancreatic.  Cervical, Uterine, and Ovarian Cancer Your health care provider may recommend that you be screened regularly for cancer of the pelvic organs. These include your ovaries, uterus, and vagina. This screening involves a pelvic exam, which includes checking for microscopic changes to the surface of your cervix (Pap test).  For women ages 21-65, health care providers may recommend a pelvic exam and a Pap test every three years. For women ages 30-65, they may recommend the Pap test and pelvic exam, combined with testing for human papilloma virus (HPV), every five years. Some types of HPV increase your risk of cervical cancer. Testing for HPV may also be done on women of any age who have unclear Pap test results.  Other health care providers may not recommend any screening for nonpregnant women who are considered low risk for pelvic cancer and have no symptoms. Ask your health care provider if a screening pelvic exam is right for you.  If you have had past treatment for cervical cancer or a condition that could lead to cancer, you need Pap tests and screening for cancer for at least 20 years after your treatment. If Pap tests have been discontinued for you, your risk factors (such as having a new  sexual partner) need to be reassessed to determine if you should start having screenings again. Some women have medical problems that increase the chance of getting cervical cancer. In these cases, your health care provider may recommend that you have screening and Pap tests more often.  If you have a family history of uterine cancer or ovarian cancer, talk with your health care provider about genetic screening.  If you have vaginal bleeding after reaching menopause, tell your health care provider.  There are currently no reliable tests available to screen for ovarian cancer.  Lung Cancer Lung cancer screening is recommended for adults 55-80 years old who are at high risk for lung cancer because of a history of smoking. A yearly low-dose CT scan of the lungs is recommended if you:  Currently smoke.  Have a history of at least 30 pack-years of smoking and you currently smoke or have quit within the past 15 years. A pack-year is smoking an average of one pack of cigarettes per day for one year.  Yearly screening should:  Continue until it has been 15 years since you quit.  Stop if you develop a health problem that would prevent you from having lung cancer treatment.  Colorectal Cancer  This type of cancer can be detected and can often be prevented.  Routine   colorectal cancer screening usually begins at age 50 and continues through age 75.  If you have risk factors for colon cancer, your health care provider may recommend that you be screened at an earlier age.  If you have a family history of colorectal cancer, talk with your health care provider about genetic screening.  Your health care provider may also recommend using home test kits to check for hidden blood in your stool.  A small camera at the end of a tube can be used to examine your colon directly (sigmoidoscopy or colonoscopy). This is done to check for the earliest forms of colorectal cancer.  Direct examination of the  colon should be repeated every 5-10 years until age 75. However, if early forms of precancerous polyps or small growths are found or if you have a family history or genetic risk for colorectal cancer, you may need to be screened more often.  Skin Cancer  Check your skin from head to toe regularly.  Monitor any moles. Be sure to tell your health care provider: ? About any new moles or changes in moles, especially if there is a change in a mole's shape or color. ? If you have a mole that is larger than the size of a pencil eraser.  If any of your family members has a history of skin cancer, especially at a Ellorie Kindall age, talk with your health care provider about genetic screening.  Always use sunscreen. Apply sunscreen liberally and repeatedly throughout the day.  Whenever you are outside, protect yourself by wearing long sleeves, pants, a wide-brimmed hat, and sunglasses.  What should I know about osteoporosis? Osteoporosis is a condition in which bone destruction happens more quickly than new bone creation. After menopause, you may be at an increased risk for osteoporosis. To help prevent osteoporosis or the bone fractures that can happen because of osteoporosis, the following is recommended:  If you are 19-50 years old, get at least 1,000 mg of calcium and at least 600 mg of vitamin D per day.  If you are older than age 50 but younger than age 70, get at least 1,200 mg of calcium and at least 600 mg of vitamin D per day.  If you are older than age 70, get at least 1,200 mg of calcium and at least 800 mg of vitamin D per day.  Smoking and excessive alcohol intake increase the risk of osteoporosis. Eat foods that are rich in calcium and vitamin D, and do weight-bearing exercises several times each week as directed by your health care provider. What should I know about how menopause affects my mental health? Depression may occur at any age, but it is more common as you become older. Common  symptoms of depression include:  Low or sad mood.  Changes in sleep patterns.  Changes in appetite or eating patterns.  Feeling an overall lack of motivation or enjoyment of activities that you previously enjoyed.  Frequent crying spells.  Talk with your health care provider if you think that you are experiencing depression. What should I know about immunizations? It is important that you get and maintain your immunizations. These include:  Tetanus, diphtheria, and pertussis (Tdap) booster vaccine.  Influenza every year before the flu season begins.  Pneumonia vaccine.  Shingles vaccine.  Your health care provider may also recommend other immunizations. This information is not intended to replace advice given to you by your health care provider. Make sure you discuss any questions you have with your   health care provider. Document Released: 06/19/2005 Document Revised: 11/15/2015 Document Reviewed: 01/29/2015 [employer] Interactive Patient Education  2018 [employer].

## 2017-02-16 NOTE — Progress Notes (Signed)
BETSAIDA [place] July 03, 1965 [id number]    History:    Presents for annual exam.  Postmenopausal 2 years on no HRT with no bleeding. Normal Pap and mammogram history. Has not had a screening colonoscopy. Primary care manages hypercholesterolemia, hypertension, migraines and anxiety/depression. History of infertility.  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  ROS:  A ROS was performed and pertinent positives and negatives are included.  Exam:  Vitals:   02/16/17 1132  BP: 112/72  Weight: 180 lb (81.6 kg)  Height: 5\' 5"  (1.651 m)   Body mass index is 29.95 kg/m.   General appearance:  Normal Thyroid:  Symmetrical, normal in size, without palpable masses or nodularity. Respiratory  Auscultation:  Clear without wheezing or rhonchi Cardiovascular  Auscultation:  Regular rate, without rubs, murmurs or gallops  Edema/varicosities:  Not grossly evident Abdominal  Soft,nontender, without masses, guarding or rebound.  Liver/spleen:  No organomegaly noted  Hernia:  None appreciated  Skin  Inspection:  Grossly normal   Breasts: Examined lying and sitting.     Right: Without masses, retractions, discharge or axillary adenopathy.     Left: Without masses, retractions, discharge or axillary adenopathy. Gentitourinary   Inguinal/mons:  Normal without inguinal adenopathy  External genitalia:  Normal  BUS/Urethra/Skene's glands:  Normal  Vagina:  Normal  Cervix:  Stenotic  Uterus:  normal in size, shape and contour.  Midline and mobile  Adnexa/parametria:     Rt: Without masses or tenderness.   Lt: Without masses or tenderness.  Anus and perineum: Normal  Digital rectal exam: Normal sphincter tone without palpated masses or tenderness  Assessment/Plan:  51 y.o. MWF G0  for annual exam with no GYN complaints.  Postmenopausal on no  HRT Hypercholesteremia,  hypertension, anxiety/depression-primary care manages labs and meds  Plan: Screening colonoscopy reviewed importance, [facility] information given, instructed to schedule. SBE's, continue annual screening mammogram, calcium rich diet, vitamin D 2000 daily encouraged. Encouraged increased exercise and decrease calorie/carbs for weight loss. Pap with HR HPV typing, new screening guidelines reviewed.    [place], 12:52 PM 02/16/2017

## 2017-02-17 LAB — PAP IG AND HPV HIGH-RISK: HPV DNA High Risk: NOT DETECTED

## 2017-02-26 DIAGNOSIS — J3089 Other allergic rhinitis: Secondary | ICD-10-CM | POA: Diagnosis not present

## 2017-02-26 DIAGNOSIS — J301 Allergic rhinitis due to pollen: Secondary | ICD-10-CM | POA: Diagnosis not present

## 2017-02-26 DIAGNOSIS — J3081 Allergic rhinitis due to animal (cat) (dog) hair and dander: Secondary | ICD-10-CM | POA: Diagnosis not present

## 2017-03-01 DIAGNOSIS — J3081 Allergic rhinitis due to animal (cat) (dog) hair and dander: Secondary | ICD-10-CM | POA: Diagnosis not present

## 2017-03-01 DIAGNOSIS — J3089 Other allergic rhinitis: Secondary | ICD-10-CM | POA: Diagnosis not present

## 2017-03-09 DIAGNOSIS — Z23 Encounter for immunization: Secondary | ICD-10-CM | POA: Diagnosis not present

## 2017-03-09 DIAGNOSIS — D2272 Melanocytic nevi of left lower limb, including hip: Secondary | ICD-10-CM | POA: Diagnosis not present

## 2017-03-09 DIAGNOSIS — L821 Other seborrheic keratosis: Secondary | ICD-10-CM | POA: Diagnosis not present

## 2017-03-09 DIAGNOSIS — D225 Melanocytic nevi of trunk: Secondary | ICD-10-CM | POA: Diagnosis not present

## 2017-03-17 DIAGNOSIS — J3089 Other allergic rhinitis: Secondary | ICD-10-CM | POA: Diagnosis not present

## 2017-03-17 DIAGNOSIS — J301 Allergic rhinitis due to pollen: Secondary | ICD-10-CM | POA: Diagnosis not present

## 2017-03-17 DIAGNOSIS — J3081 Allergic rhinitis due to animal (cat) (dog) hair and dander: Secondary | ICD-10-CM | POA: Diagnosis not present

## 2017-03-23 DIAGNOSIS — J301 Allergic rhinitis due to pollen: Secondary | ICD-10-CM | POA: Diagnosis not present

## 2017-03-23 DIAGNOSIS — J3081 Allergic rhinitis due to animal (cat) (dog) hair and dander: Secondary | ICD-10-CM | POA: Diagnosis not present

## 2017-03-23 DIAGNOSIS — J3089 Other allergic rhinitis: Secondary | ICD-10-CM | POA: Diagnosis not present

## 2017-03-24 MED FILL — TOPIRAMATE 50 MG TABLET: 50 | 90 days supply | Qty: 90 | Fill #0

## 2017-03-30 DIAGNOSIS — J3089 Other allergic rhinitis: Secondary | ICD-10-CM | POA: Diagnosis not present

## 2017-03-30 DIAGNOSIS — J301 Allergic rhinitis due to pollen: Secondary | ICD-10-CM | POA: Diagnosis not present

## 2017-03-30 DIAGNOSIS — J3081 Allergic rhinitis due to animal (cat) (dog) hair and dander: Secondary | ICD-10-CM | POA: Diagnosis not present

## 2017-04-07 DIAGNOSIS — J3081 Allergic rhinitis due to animal (cat) (dog) hair and dander: Secondary | ICD-10-CM | POA: Diagnosis not present

## 2017-04-07 DIAGNOSIS — J301 Allergic rhinitis due to pollen: Secondary | ICD-10-CM | POA: Diagnosis not present

## 2017-04-07 DIAGNOSIS — J3089 Other allergic rhinitis: Secondary | ICD-10-CM | POA: Diagnosis not present

## 2017-04-12 ENCOUNTER — Encounter: Payer: Self-pay | Admitting: Internal Medicine

## 2017-04-23 MED FILL — SERTRALINE HCL 100 MG TAB: 100 | 90 days supply | Qty: 90 | Fill #2

## 2017-05-07 DIAGNOSIS — J3089 Other allergic rhinitis: Secondary | ICD-10-CM | POA: Diagnosis not present

## 2017-05-07 DIAGNOSIS — J301 Allergic rhinitis due to pollen: Secondary | ICD-10-CM | POA: Diagnosis not present

## 2017-05-07 DIAGNOSIS — J3081 Allergic rhinitis due to animal (cat) (dog) hair and dander: Secondary | ICD-10-CM | POA: Diagnosis not present

## 2017-05-10 DIAGNOSIS — J3089 Other allergic rhinitis: Secondary | ICD-10-CM | POA: Diagnosis not present

## 2017-05-10 DIAGNOSIS — J3081 Allergic rhinitis due to animal (cat) (dog) hair and dander: Secondary | ICD-10-CM | POA: Diagnosis not present

## 2017-05-10 DIAGNOSIS — J301 Allergic rhinitis due to pollen: Secondary | ICD-10-CM | POA: Diagnosis not present

## 2017-05-17 DIAGNOSIS — J3089 Other allergic rhinitis: Secondary | ICD-10-CM | POA: Diagnosis not present

## 2017-05-17 DIAGNOSIS — J3081 Allergic rhinitis due to animal (cat) (dog) hair and dander: Secondary | ICD-10-CM | POA: Diagnosis not present

## 2017-05-17 DIAGNOSIS — J301 Allergic rhinitis due to pollen: Secondary | ICD-10-CM | POA: Diagnosis not present

## 2017-05-18 MED FILL — EPINEPHRINE 0.3 MG AUTO-INJ: 0.3 | 30 days supply | Qty: 2 | Fill #1

## 2017-05-24 DIAGNOSIS — J3089 Other allergic rhinitis: Secondary | ICD-10-CM | POA: Diagnosis not present

## 2017-05-24 DIAGNOSIS — J301 Allergic rhinitis due to pollen: Secondary | ICD-10-CM | POA: Diagnosis not present

## 2017-05-24 DIAGNOSIS — J3081 Allergic rhinitis due to animal (cat) (dog) hair and dander: Secondary | ICD-10-CM | POA: Diagnosis not present

## 2017-05-31 DIAGNOSIS — J3081 Allergic rhinitis due to animal (cat) (dog) hair and dander: Secondary | ICD-10-CM | POA: Diagnosis not present

## 2017-05-31 DIAGNOSIS — J3089 Other allergic rhinitis: Secondary | ICD-10-CM | POA: Diagnosis not present

## 2017-05-31 DIAGNOSIS — J301 Allergic rhinitis due to pollen: Secondary | ICD-10-CM | POA: Diagnosis not present

## 2017-05-31 MED FILL — LEVOCETIRIZINE 5 MG TABLET: 5 | 90 days supply | Qty: 90 | Fill #0

## 2017-06-01 MED FILL — ROSUVASTATIN CALCIUM 10 MG: 10 | 90 days supply | Qty: 90 | Fill #0

## 2017-06-07 DIAGNOSIS — J3081 Allergic rhinitis due to animal (cat) (dog) hair and dander: Secondary | ICD-10-CM | POA: Diagnosis not present

## 2017-06-07 DIAGNOSIS — J3089 Other allergic rhinitis: Secondary | ICD-10-CM | POA: Diagnosis not present

## 2017-06-07 DIAGNOSIS — J301 Allergic rhinitis due to pollen: Secondary | ICD-10-CM | POA: Diagnosis not present

## 2017-06-09 DIAGNOSIS — Z8669 Personal history of other diseases of the nervous system and sense organs: Secondary | ICD-10-CM | POA: Diagnosis not present

## 2017-06-09 DIAGNOSIS — F338 Other recurrent depressive disorders: Secondary | ICD-10-CM | POA: Diagnosis not present

## 2017-06-09 MED FILL — buPROPion HCL ER (XL) 150 M: 150 | 30 days supply | Qty: 30 | Fill #0

## 2017-06-09 MED FILL — PROMETHAZINE 25 MG TABLET: 25 | 3 days supply | Qty: 30 | Fill #0

## 2017-06-11 ENCOUNTER — Encounter: Payer: 59 | Admitting: Internal Medicine

## 2017-06-23 DIAGNOSIS — J3081 Allergic rhinitis due to animal (cat) (dog) hair and dander: Secondary | ICD-10-CM | POA: Diagnosis not present

## 2017-06-23 DIAGNOSIS — J301 Allergic rhinitis due to pollen: Secondary | ICD-10-CM | POA: Diagnosis not present

## 2017-06-23 DIAGNOSIS — J3089 Other allergic rhinitis: Secondary | ICD-10-CM | POA: Diagnosis not present

## 2017-07-05 DIAGNOSIS — J301 Allergic rhinitis due to pollen: Secondary | ICD-10-CM | POA: Diagnosis not present

## 2017-07-05 DIAGNOSIS — J3089 Other allergic rhinitis: Secondary | ICD-10-CM | POA: Diagnosis not present

## 2017-07-05 DIAGNOSIS — J3081 Allergic rhinitis due to animal (cat) (dog) hair and dander: Secondary | ICD-10-CM | POA: Diagnosis not present

## 2017-07-12 DIAGNOSIS — J3081 Allergic rhinitis due to animal (cat) (dog) hair and dander: Secondary | ICD-10-CM | POA: Diagnosis not present

## 2017-07-12 DIAGNOSIS — J301 Allergic rhinitis due to pollen: Secondary | ICD-10-CM | POA: Diagnosis not present

## 2017-07-12 DIAGNOSIS — J3089 Other allergic rhinitis: Secondary | ICD-10-CM | POA: Diagnosis not present

## 2017-07-13 MED FILL — TOPIRAMATE 50 MG TABLET: 50 | 90 days supply | Qty: 90 | Fill #1

## 2017-07-14 MED FILL — SUPREP BOWEL PREP KIT: 17.5-3.13-1 | 2 days supply | Qty: 354 | Fill #0

## 2017-07-19 DIAGNOSIS — J3089 Other allergic rhinitis: Secondary | ICD-10-CM | POA: Diagnosis not present

## 2017-07-19 DIAGNOSIS — J301 Allergic rhinitis due to pollen: Secondary | ICD-10-CM | POA: Diagnosis not present

## 2017-07-19 DIAGNOSIS — J3081 Allergic rhinitis due to animal (cat) (dog) hair and dander: Secondary | ICD-10-CM | POA: Diagnosis not present

## 2017-08-03 DIAGNOSIS — Q438 Other specified congenital malformations of intestine: Secondary | ICD-10-CM | POA: Diagnosis not present

## 2017-08-03 DIAGNOSIS — K635 Polyp of colon: Secondary | ICD-10-CM | POA: Diagnosis not present

## 2017-08-03 DIAGNOSIS — D126 Benign neoplasm of colon, unspecified: Secondary | ICD-10-CM | POA: Diagnosis not present

## 2017-08-03 DIAGNOSIS — K5730 Diverticulosis of large intestine without perforation or abscess without bleeding: Secondary | ICD-10-CM | POA: Diagnosis not present

## 2017-08-03 DIAGNOSIS — Z1211 Encounter for screening for malignant neoplasm of colon: Secondary | ICD-10-CM | POA: Diagnosis not present

## 2017-08-06 MED FILL — SERTRALINE HCL 100 MG TAB: 100 | 30 days supply | Qty: 30 | Fill #0

## 2017-08-11 DIAGNOSIS — J3089 Other allergic rhinitis: Secondary | ICD-10-CM | POA: Diagnosis not present

## 2017-08-11 DIAGNOSIS — J3081 Allergic rhinitis due to animal (cat) (dog) hair and dander: Secondary | ICD-10-CM | POA: Diagnosis not present

## 2017-08-11 DIAGNOSIS — J301 Allergic rhinitis due to pollen: Secondary | ICD-10-CM | POA: Diagnosis not present

## 2017-08-16 ENCOUNTER — Other Ambulatory Visit: Payer: Self-pay | Admitting: Family Medicine

## 2017-08-16 DIAGNOSIS — J301 Allergic rhinitis due to pollen: Secondary | ICD-10-CM | POA: Diagnosis not present

## 2017-08-16 DIAGNOSIS — J3089 Other allergic rhinitis: Secondary | ICD-10-CM | POA: Diagnosis not present

## 2017-08-16 DIAGNOSIS — Z1231 Encounter for screening mammogram for malignant neoplasm of breast: Secondary | ICD-10-CM

## 2017-08-16 DIAGNOSIS — J3081 Allergic rhinitis due to animal (cat) (dog) hair and dander: Secondary | ICD-10-CM | POA: Diagnosis not present

## 2017-08-25 DIAGNOSIS — Z0000 Encounter for general adult medical examination without abnormal findings: Secondary | ICD-10-CM | POA: Diagnosis not present

## 2017-08-25 DIAGNOSIS — J3089 Other allergic rhinitis: Secondary | ICD-10-CM | POA: Diagnosis not present

## 2017-08-25 DIAGNOSIS — E782 Mixed hyperlipidemia: Secondary | ICD-10-CM | POA: Diagnosis not present

## 2017-08-25 DIAGNOSIS — Z86010 Personal history of colonic polyps: Secondary | ICD-10-CM | POA: Diagnosis not present

## 2017-08-25 DIAGNOSIS — Z8669 Personal history of other diseases of the nervous system and sense organs: Secondary | ICD-10-CM | POA: Diagnosis not present

## 2017-08-25 DIAGNOSIS — F338 Other recurrent depressive disorders: Secondary | ICD-10-CM | POA: Diagnosis not present

## 2017-08-25 DIAGNOSIS — D649 Anemia, unspecified: Secondary | ICD-10-CM | POA: Diagnosis not present

## 2017-08-25 DIAGNOSIS — R3129 Other microscopic hematuria: Secondary | ICD-10-CM | POA: Diagnosis not present

## 2017-08-25 DIAGNOSIS — F419 Anxiety disorder, unspecified: Secondary | ICD-10-CM | POA: Diagnosis not present

## 2017-08-25 MED FILL — buPROPion HCL ER (XL) 150 M: 150 | 90 days supply | Qty: 90 | Fill #0

## 2017-09-06 DIAGNOSIS — J3081 Allergic rhinitis due to animal (cat) (dog) hair and dander: Secondary | ICD-10-CM | POA: Diagnosis not present

## 2017-09-06 DIAGNOSIS — J301 Allergic rhinitis due to pollen: Secondary | ICD-10-CM | POA: Diagnosis not present

## 2017-09-06 DIAGNOSIS — J3089 Other allergic rhinitis: Secondary | ICD-10-CM | POA: Diagnosis not present

## 2017-09-11 MED FILL — LEVOCETIRIZINE 5 MG TABLET: 5 | 90 days supply | Qty: 90 | Fill #1

## 2017-09-13 DIAGNOSIS — J301 Allergic rhinitis due to pollen: Secondary | ICD-10-CM | POA: Diagnosis not present

## 2017-09-13 DIAGNOSIS — J3089 Other allergic rhinitis: Secondary | ICD-10-CM | POA: Diagnosis not present

## 2017-09-13 DIAGNOSIS — J3081 Allergic rhinitis due to animal (cat) (dog) hair and dander: Secondary | ICD-10-CM | POA: Diagnosis not present

## 2017-09-13 MED FILL — SUMATRIPTAN SUCC 100 MG TAB: 100 | 30 days supply | Qty: 8 | Fill #0

## 2017-09-13 MED FILL — SERTRALINE HCL 100 MG TAB: 100 | 90 days supply | Qty: 90 | Fill #0

## 2017-09-13 MED FILL — ROSUVASTATIN CALCIUM 10 MG: 10 | 90 days supply | Qty: 90 | Fill #0

## 2017-09-24 DIAGNOSIS — J3081 Allergic rhinitis due to animal (cat) (dog) hair and dander: Secondary | ICD-10-CM | POA: Diagnosis not present

## 2017-09-24 DIAGNOSIS — J301 Allergic rhinitis due to pollen: Secondary | ICD-10-CM | POA: Diagnosis not present

## 2017-09-24 DIAGNOSIS — J3089 Other allergic rhinitis: Secondary | ICD-10-CM | POA: Diagnosis not present

## 2017-09-28 DIAGNOSIS — J301 Allergic rhinitis due to pollen: Secondary | ICD-10-CM | POA: Diagnosis not present

## 2017-09-28 DIAGNOSIS — J3081 Allergic rhinitis due to animal (cat) (dog) hair and dander: Secondary | ICD-10-CM | POA: Diagnosis not present

## 2017-09-28 DIAGNOSIS — J3089 Other allergic rhinitis: Secondary | ICD-10-CM | POA: Diagnosis not present

## 2017-10-06 DIAGNOSIS — J3081 Allergic rhinitis due to animal (cat) (dog) hair and dander: Secondary | ICD-10-CM | POA: Diagnosis not present

## 2017-10-06 DIAGNOSIS — J3089 Other allergic rhinitis: Secondary | ICD-10-CM | POA: Diagnosis not present

## 2017-10-06 DIAGNOSIS — J301 Allergic rhinitis due to pollen: Secondary | ICD-10-CM | POA: Diagnosis not present

## 2017-10-12 DIAGNOSIS — J3081 Allergic rhinitis due to animal (cat) (dog) hair and dander: Secondary | ICD-10-CM | POA: Diagnosis not present

## 2017-10-12 DIAGNOSIS — J3089 Other allergic rhinitis: Secondary | ICD-10-CM | POA: Diagnosis not present

## 2017-10-12 DIAGNOSIS — J301 Allergic rhinitis due to pollen: Secondary | ICD-10-CM | POA: Diagnosis not present

## 2017-10-20 MED FILL — TOPIRAMATE 50 MG TABLET: 50 | 90 days supply | Qty: 90 | Fill #0

## 2017-11-01 ENCOUNTER — Ambulatory Visit: Payer: 59

## 2017-11-08 DIAGNOSIS — J301 Allergic rhinitis due to pollen: Secondary | ICD-10-CM | POA: Diagnosis not present

## 2017-11-08 DIAGNOSIS — J3081 Allergic rhinitis due to animal (cat) (dog) hair and dander: Secondary | ICD-10-CM | POA: Diagnosis not present

## 2017-11-08 DIAGNOSIS — J3089 Other allergic rhinitis: Secondary | ICD-10-CM | POA: Diagnosis not present

## 2017-11-10 DIAGNOSIS — J3089 Other allergic rhinitis: Secondary | ICD-10-CM | POA: Diagnosis not present

## 2017-11-10 DIAGNOSIS — J301 Allergic rhinitis due to pollen: Secondary | ICD-10-CM | POA: Diagnosis not present

## 2017-11-10 DIAGNOSIS — J3081 Allergic rhinitis due to animal (cat) (dog) hair and dander: Secondary | ICD-10-CM | POA: Diagnosis not present

## 2017-11-24 DIAGNOSIS — J3081 Allergic rhinitis due to animal (cat) (dog) hair and dander: Secondary | ICD-10-CM | POA: Diagnosis not present

## 2017-11-24 DIAGNOSIS — J301 Allergic rhinitis due to pollen: Secondary | ICD-10-CM | POA: Diagnosis not present

## 2017-11-24 DIAGNOSIS — J3089 Other allergic rhinitis: Secondary | ICD-10-CM | POA: Diagnosis not present

## 2017-11-25 DIAGNOSIS — J301 Allergic rhinitis due to pollen: Secondary | ICD-10-CM | POA: Diagnosis not present

## 2017-11-26 DIAGNOSIS — J3089 Other allergic rhinitis: Secondary | ICD-10-CM | POA: Diagnosis not present

## 2017-11-26 DIAGNOSIS — J3081 Allergic rhinitis due to animal (cat) (dog) hair and dander: Secondary | ICD-10-CM | POA: Diagnosis not present

## 2017-12-03 ENCOUNTER — Ambulatory Visit
Admission: RE | Admit: 2017-12-03 | Discharge: 2017-12-03 | Disposition: A | Discharge status: Home / Self Care | Payer: 59 | Source: Ambulatory Visit | Attending: Family Medicine | Admitting: Family Medicine

## 2017-12-03 DIAGNOSIS — J3089 Other allergic rhinitis: Secondary | ICD-10-CM | POA: Diagnosis not present

## 2017-12-03 DIAGNOSIS — J3081 Allergic rhinitis due to animal (cat) (dog) hair and dander: Secondary | ICD-10-CM | POA: Diagnosis not present

## 2017-12-03 DIAGNOSIS — Z1231 Encounter for screening mammogram for malignant neoplasm of breast: Secondary | ICD-10-CM

## 2017-12-03 DIAGNOSIS — J301 Allergic rhinitis due to pollen: Secondary | ICD-10-CM | POA: Diagnosis not present

## 2017-12-06 ENCOUNTER — Encounter (INDEPENDENT_AMBULATORY_CARE_PROVIDER_SITE_OTHER): Payer: Self-pay

## 2017-12-09 DIAGNOSIS — S61011A Laceration without foreign body of right thumb without damage to nail, initial encounter: Secondary | ICD-10-CM | POA: Diagnosis not present

## 2017-12-10 DIAGNOSIS — J3081 Allergic rhinitis due to animal (cat) (dog) hair and dander: Secondary | ICD-10-CM | POA: Diagnosis not present

## 2017-12-10 DIAGNOSIS — J3089 Other allergic rhinitis: Secondary | ICD-10-CM | POA: Diagnosis not present

## 2017-12-10 DIAGNOSIS — J301 Allergic rhinitis due to pollen: Secondary | ICD-10-CM | POA: Diagnosis not present

## 2017-12-19 MED FILL — SERTRALINE HCL 100 MG TAB: 100 | 90 days supply | Qty: 90 | Fill #1

## 2017-12-19 MED FILL — ROSUVASTATIN CALCIUM 10 MG: 10 | 90 days supply | Qty: 90 | Fill #1

## 2017-12-20 MED FILL — LEVOCETIRIZINE 5 MG TABLET: 5 | 90 days supply | Qty: 90 | Fill #0

## 2017-12-21 DIAGNOSIS — J3081 Allergic rhinitis due to animal (cat) (dog) hair and dander: Secondary | ICD-10-CM | POA: Diagnosis not present

## 2017-12-21 DIAGNOSIS — J301 Allergic rhinitis due to pollen: Secondary | ICD-10-CM | POA: Diagnosis not present

## 2017-12-21 DIAGNOSIS — J3089 Other allergic rhinitis: Secondary | ICD-10-CM | POA: Diagnosis not present

## 2017-12-24 DIAGNOSIS — J3089 Other allergic rhinitis: Secondary | ICD-10-CM | POA: Diagnosis not present

## 2017-12-24 DIAGNOSIS — J3081 Allergic rhinitis due to animal (cat) (dog) hair and dander: Secondary | ICD-10-CM | POA: Diagnosis not present

## 2017-12-24 DIAGNOSIS — J301 Allergic rhinitis due to pollen: Secondary | ICD-10-CM | POA: Diagnosis not present

## 2017-12-30 DIAGNOSIS — J301 Allergic rhinitis due to pollen: Secondary | ICD-10-CM | POA: Diagnosis not present

## 2017-12-30 DIAGNOSIS — J3081 Allergic rhinitis due to animal (cat) (dog) hair and dander: Secondary | ICD-10-CM | POA: Diagnosis not present

## 2017-12-30 DIAGNOSIS — J3089 Other allergic rhinitis: Secondary | ICD-10-CM | POA: Diagnosis not present

## 2018-01-06 DIAGNOSIS — E782 Mixed hyperlipidemia: Secondary | ICD-10-CM | POA: Diagnosis not present

## 2018-01-06 DIAGNOSIS — R3129 Other microscopic hematuria: Secondary | ICD-10-CM | POA: Diagnosis not present

## 2018-01-06 DIAGNOSIS — F419 Anxiety disorder, unspecified: Secondary | ICD-10-CM | POA: Diagnosis not present

## 2018-01-06 DIAGNOSIS — J301 Allergic rhinitis due to pollen: Secondary | ICD-10-CM | POA: Diagnosis not present

## 2018-01-06 DIAGNOSIS — F338 Other recurrent depressive disorders: Secondary | ICD-10-CM | POA: Diagnosis not present

## 2018-01-06 DIAGNOSIS — Z0000 Encounter for general adult medical examination without abnormal findings: Secondary | ICD-10-CM | POA: Diagnosis not present

## 2018-01-06 DIAGNOSIS — J3089 Other allergic rhinitis: Secondary | ICD-10-CM | POA: Diagnosis not present

## 2018-01-06 DIAGNOSIS — Z8669 Personal history of other diseases of the nervous system and sense organs: Secondary | ICD-10-CM | POA: Diagnosis not present

## 2018-01-06 DIAGNOSIS — Z86010 Personal history of colonic polyps: Secondary | ICD-10-CM | POA: Diagnosis not present

## 2018-01-06 DIAGNOSIS — D649 Anemia, unspecified: Secondary | ICD-10-CM | POA: Diagnosis not present

## 2018-01-06 DIAGNOSIS — J3081 Allergic rhinitis due to animal (cat) (dog) hair and dander: Secondary | ICD-10-CM | POA: Diagnosis not present

## 2018-01-21 DIAGNOSIS — J301 Allergic rhinitis due to pollen: Secondary | ICD-10-CM | POA: Diagnosis not present

## 2018-01-21 DIAGNOSIS — J3089 Other allergic rhinitis: Secondary | ICD-10-CM | POA: Diagnosis not present

## 2018-01-21 DIAGNOSIS — J3081 Allergic rhinitis due to animal (cat) (dog) hair and dander: Secondary | ICD-10-CM | POA: Diagnosis not present

## 2018-02-01 DIAGNOSIS — J3089 Other allergic rhinitis: Secondary | ICD-10-CM | POA: Diagnosis not present

## 2018-02-01 DIAGNOSIS — J301 Allergic rhinitis due to pollen: Secondary | ICD-10-CM | POA: Diagnosis not present

## 2018-02-01 DIAGNOSIS — J3081 Allergic rhinitis due to animal (cat) (dog) hair and dander: Secondary | ICD-10-CM | POA: Diagnosis not present

## 2018-02-04 MED FILL — TOPIRAMATE 50 MG TABLET: 50 | 90 days supply | Qty: 90 | Fill #1

## 2018-02-16 DIAGNOSIS — J301 Allergic rhinitis due to pollen: Secondary | ICD-10-CM | POA: Diagnosis not present

## 2018-02-16 DIAGNOSIS — J3089 Other allergic rhinitis: Secondary | ICD-10-CM | POA: Diagnosis not present

## 2018-02-16 DIAGNOSIS — J4520 Mild intermittent asthma, uncomplicated: Secondary | ICD-10-CM | POA: Diagnosis not present

## 2018-02-16 DIAGNOSIS — J3081 Allergic rhinitis due to animal (cat) (dog) hair and dander: Secondary | ICD-10-CM | POA: Diagnosis not present

## 2018-02-22 ENCOUNTER — Encounter: Payer: 59 | Admitting: Women's Health

## 2018-03-08 DIAGNOSIS — J3081 Allergic rhinitis due to animal (cat) (dog) hair and dander: Secondary | ICD-10-CM | POA: Diagnosis not present

## 2018-03-08 DIAGNOSIS — J301 Allergic rhinitis due to pollen: Secondary | ICD-10-CM | POA: Diagnosis not present

## 2018-03-08 DIAGNOSIS — J3089 Other allergic rhinitis: Secondary | ICD-10-CM | POA: Diagnosis not present

## 2018-03-15 DIAGNOSIS — D485 Neoplasm of uncertain behavior of skin: Secondary | ICD-10-CM | POA: Diagnosis not present

## 2018-03-15 DIAGNOSIS — Z23 Encounter for immunization: Secondary | ICD-10-CM | POA: Diagnosis not present

## 2018-03-15 DIAGNOSIS — D2272 Melanocytic nevi of left lower limb, including hip: Secondary | ICD-10-CM | POA: Diagnosis not present

## 2018-03-15 DIAGNOSIS — D225 Melanocytic nevi of trunk: Secondary | ICD-10-CM | POA: Diagnosis not present

## 2018-03-15 DIAGNOSIS — L821 Other seborrheic keratosis: Secondary | ICD-10-CM | POA: Diagnosis not present

## 2018-03-18 DIAGNOSIS — J301 Allergic rhinitis due to pollen: Secondary | ICD-10-CM | POA: Diagnosis not present

## 2018-03-18 DIAGNOSIS — J3081 Allergic rhinitis due to animal (cat) (dog) hair and dander: Secondary | ICD-10-CM | POA: Diagnosis not present

## 2018-03-18 DIAGNOSIS — J3089 Other allergic rhinitis: Secondary | ICD-10-CM | POA: Diagnosis not present

## 2018-03-25 DIAGNOSIS — J301 Allergic rhinitis due to pollen: Secondary | ICD-10-CM | POA: Diagnosis not present

## 2018-03-25 DIAGNOSIS — J3089 Other allergic rhinitis: Secondary | ICD-10-CM | POA: Diagnosis not present

## 2018-03-25 DIAGNOSIS — J3081 Allergic rhinitis due to animal (cat) (dog) hair and dander: Secondary | ICD-10-CM | POA: Diagnosis not present

## 2018-04-11 MED FILL — SERTRALINE HCL 100 MG TAB: 100 | 90 days supply | Qty: 90 | Fill #2

## 2018-04-11 MED FILL — ROSUVASTATIN CALCIUM 10 MG: 10 | 90 days supply | Qty: 90 | Fill #2

## 2018-04-11 MED FILL — LEVOCETIRIZINE 5 MG TABLET: 5 | 90 days supply | Qty: 90 | Fill #0

## 2018-04-13 DIAGNOSIS — J301 Allergic rhinitis due to pollen: Secondary | ICD-10-CM | POA: Diagnosis not present

## 2018-04-13 DIAGNOSIS — J3089 Other allergic rhinitis: Secondary | ICD-10-CM | POA: Diagnosis not present

## 2018-04-13 DIAGNOSIS — J3081 Allergic rhinitis due to animal (cat) (dog) hair and dander: Secondary | ICD-10-CM | POA: Diagnosis not present

## 2018-04-18 ENCOUNTER — Encounter: Payer: 59 | Admitting: Women's Health

## 2018-04-18 DIAGNOSIS — J301 Allergic rhinitis due to pollen: Secondary | ICD-10-CM | POA: Diagnosis not present

## 2018-04-18 DIAGNOSIS — J3089 Other allergic rhinitis: Secondary | ICD-10-CM | POA: Diagnosis not present

## 2018-04-18 DIAGNOSIS — J3081 Allergic rhinitis due to animal (cat) (dog) hair and dander: Secondary | ICD-10-CM | POA: Diagnosis not present

## 2018-05-12 DIAGNOSIS — J3089 Other allergic rhinitis: Secondary | ICD-10-CM | POA: Diagnosis not present

## 2018-05-12 DIAGNOSIS — J301 Allergic rhinitis due to pollen: Secondary | ICD-10-CM | POA: Diagnosis not present

## 2018-05-12 DIAGNOSIS — J3081 Allergic rhinitis due to animal (cat) (dog) hair and dander: Secondary | ICD-10-CM | POA: Diagnosis not present

## 2018-05-19 MED FILL — TOPIRAMATE 50 MG TABLET: 50 | 90 days supply | Qty: 90 | Fill #2

## 2018-05-24 DIAGNOSIS — J301 Allergic rhinitis due to pollen: Secondary | ICD-10-CM | POA: Diagnosis not present

## 2018-05-24 DIAGNOSIS — J3081 Allergic rhinitis due to animal (cat) (dog) hair and dander: Secondary | ICD-10-CM | POA: Diagnosis not present

## 2018-05-24 DIAGNOSIS — J3089 Other allergic rhinitis: Secondary | ICD-10-CM | POA: Diagnosis not present

## 2018-05-25 MED FILL — EPINEPHRINE 0.3 MG AUTO-INJ: 0.3 | 29 days supply | Qty: 2 | Fill #0

## 2018-05-30 MED FILL — OLOPATADINE HCL 0.2% EYE DR: 0.2 | 25 days supply | Qty: 3 | Fill #0

## 2018-05-30 MED FILL — MOMETASONE FUROATE 50 MCG S: 50 | 30 days supply | Qty: 17 | Fill #0

## 2018-06-07 DIAGNOSIS — J3081 Allergic rhinitis due to animal (cat) (dog) hair and dander: Secondary | ICD-10-CM | POA: Diagnosis not present

## 2018-06-07 DIAGNOSIS — J3089 Other allergic rhinitis: Secondary | ICD-10-CM | POA: Diagnosis not present

## 2018-06-07 DIAGNOSIS — J301 Allergic rhinitis due to pollen: Secondary | ICD-10-CM | POA: Diagnosis not present

## 2018-06-15 ENCOUNTER — Telehealth: Payer: 59 | Admitting: Nurse Practitioner

## 2018-06-15 DIAGNOSIS — J069 Acute upper respiratory infection, unspecified: Secondary | ICD-10-CM | POA: Diagnosis not present

## 2018-06-15 MED ORDER — FLUTICASONE PROPIONATE 50 MCG/ACT NA SUSP: 2.0000 | Freq: Every day | NASAL | 6 refills | Status: DC

## 2018-06-15 MED ORDER — BENZONATATE 100 MG PO CAPS: 100.0000 mg | ORAL_CAPSULE | Freq: Three times a day (TID) | ORAL | 0 refills | Status: DC | PRN

## 2018-06-15 MED FILL — BENZONATATE 100 MG CAP: 100 | 6 days supply | Qty: 20 | Fill #0

## 2018-06-15 MED FILL — FLUTICASONE PROP 50 MCG SPR: 50 | 30 days supply | Qty: 16 | Fill #0

## 2018-06-15 NOTE — Progress Notes (Signed)
We are sorry you are not feeling well.  Here is how we plan to help!  Based on what you have shared with me, it looks like you may have a viral upper respiratory infection or a "common cold".  Colds are caused by a large number of viruses; however, rhinovirus is the most common cause.   Symptoms of the common cold vary from person to person, with common symptoms including sore throat, cough, and malaise.  A low-grade fever of 100.4 may present, but is often uncommon.  Symptoms vary however, and are closely related to a person's age or underlying illnesses.  The most common symptoms associated with the common cold are nasal discharge or congestion, cough, sneezing, headache and pressure in the ears and face.  Cold symptoms usually persist for about 3 to 10 days, but can last up to 2 weeks.  It is important to know that colds do not cause serious illness or complications in most cases.    The common cold is transmitted from person to person, with the most common method of transmission being a person's hands.  The virus is able to live on the skin and can infect other persons for up to 2 hours after direct contact.  Also, colds are transmitted when someone coughs or sneezes; thus, it is important to cover the mouth to reduce this risk.  To keep the spread of the common cold at [place], good hand hygiene is very important.  This is an infection that is most likely caused by a virus. There are no specific treatments for the common cold other than to help you with the symptoms until the infection runs its course.    For nasal congestion, you may use an oral decongestants such as Mucinex D or if you have glaucoma or high blood pressure use plain Mucinex.  Saline nasal spray or nasal drops can help and can safely be used as often as needed for congestion.  For your congestion, I have prescribed Fluticasone nasal spray one spray in each nostril twice a day  If you do not have a history of heart disease, hypertension,  diabetes or thyroid disease, prostate/bladder issues or glaucoma, you may also use Sudafed to treat nasal congestion.  It is highly recommended that you consult with a pharmacist or your primary care physician to ensure this medication is safe for you to take.     If you have a cough, you may use cough suppressants such as Delsym and Robitussin.  If you have glaucoma or high blood pressure, you can also use Coricidin HBP.   For cough I have prescribed for you A prescription cough medication called Tessalon Perles 100 mg. You may take 1-2 capsules every 8 hours as needed for cough  If you have a sore or scratchy throat, use a saltwater gargle-  to  teaspoon of salt dissolved in a 4-ounce to 8-ounce glass of warm water.  Gargle the solution for approximately 15-30 seconds and then spit.  It is important not to swallow the solution.  You can also use throat lozenges/cough drops and Chloraseptic spray to help with throat pain or discomfort.  Warm or cold liquids can also be helpful in relieving throat pain.  For headache, pain or general discomfort, you can use Ibuprofen or Tylenol as directed.   Some authorities believe that zinc sprays or the use of Echinacea may shorten the course of your symptoms.   HOME CARE . Only take medications as instructed by your  medical team. . Be sure to drink plenty of fluids. Water is fine as well as fruit juices, sodas and electrolyte beverages. You may want to stay away from caffeine or alcohol. If you are nauseated, try taking small sips of liquids. How do you know if you are getting enough fluid? Your urine should be a pale yellow or almost colorless. . Get rest. . Taking a steamy shower or using a humidifier may help nasal congestion and ease sore throat pain. You can place a towel over your head and breathe in the steam from hot water coming from a faucet. . Using a saline nasal spray works much the same way. . Cough drops, hard candies and sore throat lozenges  may ease your cough. . Avoid close contacts especially the very young and the elderly . Cover your mouth if you cough or sneeze . Always remember to wash your hands.   GET HELP RIGHT AWAY IF: . You develop worsening fever. . If your symptoms do not improve within 10 days . You develop yellow or green discharge from your nose over 3 days. . You have coughing fits . You develop a severe head ache or visual changes. . You develop shortness of breath, difficulty breathing or start having chest pain . Your symptoms persist after you have completed your treatment plan  MAKE SURE YOU   Understand these instructions.  Will watch your condition.  Will get help right away if you are not doing well or get worse.  Your e-visit answers were reviewed by a board certified advanced clinical practitioner to complete your personal care plan. Depending upon the condition, your plan could have included both over the counter or prescription medications. Please review your pharmacy choice. If there is a problem, you may call our nursing hot line at and have the prescription routed to another pharmacy. Your safety is important to [place]. If you have drug allergies check your prescription carefully.   You can use MyChart to ask questions about today's visit, request a non-urgent call back, or ask for a work or school excuse for 24 hours related to this e-Visit. If it has been greater than 24 hours you will need to follow up with your provider, or enter a new e-Visit to address those concerns. You will get an e-mail in the next two days asking about your experience.  I hope that your e-visit has been valuable and will speed your recovery. Thank you for using e-visits.   5 minutes spent reviewing and documenting in chart.

## 2018-06-18 ENCOUNTER — Telehealth: Payer: 59 | Admitting: Nurse Practitioner

## 2018-06-18 DIAGNOSIS — J0100 Acute maxillary sinusitis, unspecified: Secondary | ICD-10-CM

## 2018-06-18 MED ORDER — AMOXICILLIN-POT CLAVULANATE 875-125 MG PO TABS: 1.0000 | ORAL_TABLET | Freq: Two times a day (BID) | ORAL | 0 refills | Status: DC

## 2018-06-18 NOTE — Progress Notes (Signed)

## 2018-06-29 DIAGNOSIS — J3089 Other allergic rhinitis: Secondary | ICD-10-CM | POA: Diagnosis not present

## 2018-06-29 DIAGNOSIS — J3081 Allergic rhinitis due to animal (cat) (dog) hair and dander: Secondary | ICD-10-CM | POA: Diagnosis not present

## 2018-06-29 DIAGNOSIS — J301 Allergic rhinitis due to pollen: Secondary | ICD-10-CM | POA: Diagnosis not present

## 2018-07-19 MED FILL — ROSUVASTATIN CALCIUM 10 MG: 10 | 90 days supply | Qty: 90 | Fill #3

## 2018-07-19 MED FILL — SERTRALINE HCL 100 MG TAB: 100 | 90 days supply | Qty: 90 | Fill #3

## 2018-07-19 MED FILL — LEVOCETIRIZINE 5 MG TABLET: 5 | 90 days supply | Qty: 90 | Fill #1

## 2018-07-20 DIAGNOSIS — J3081 Allergic rhinitis due to animal (cat) (dog) hair and dander: Secondary | ICD-10-CM | POA: Diagnosis not present

## 2018-07-20 DIAGNOSIS — J301 Allergic rhinitis due to pollen: Secondary | ICD-10-CM | POA: Diagnosis not present

## 2018-07-20 DIAGNOSIS — J3089 Other allergic rhinitis: Secondary | ICD-10-CM | POA: Diagnosis not present

## 2018-07-25 DIAGNOSIS — J3089 Other allergic rhinitis: Secondary | ICD-10-CM | POA: Diagnosis not present

## 2018-07-25 DIAGNOSIS — J3081 Allergic rhinitis due to animal (cat) (dog) hair and dander: Secondary | ICD-10-CM | POA: Diagnosis not present

## 2018-07-25 DIAGNOSIS — J301 Allergic rhinitis due to pollen: Secondary | ICD-10-CM | POA: Diagnosis not present

## 2018-08-03 DIAGNOSIS — J301 Allergic rhinitis due to pollen: Secondary | ICD-10-CM | POA: Diagnosis not present

## 2018-08-03 DIAGNOSIS — J3081 Allergic rhinitis due to animal (cat) (dog) hair and dander: Secondary | ICD-10-CM | POA: Diagnosis not present

## 2018-08-03 DIAGNOSIS — J3089 Other allergic rhinitis: Secondary | ICD-10-CM | POA: Diagnosis not present

## 2018-08-29 MED FILL — TOPIRAMATE 50 MG TABLET: 50 | 90 days supply | Qty: 90 | Fill #3

## 2018-08-30 DIAGNOSIS — J301 Allergic rhinitis due to pollen: Secondary | ICD-10-CM | POA: Diagnosis not present

## 2018-08-30 DIAGNOSIS — J3081 Allergic rhinitis due to animal (cat) (dog) hair and dander: Secondary | ICD-10-CM | POA: Diagnosis not present

## 2018-08-30 DIAGNOSIS — J3089 Other allergic rhinitis: Secondary | ICD-10-CM | POA: Diagnosis not present

## 2018-09-06 DIAGNOSIS — J3081 Allergic rhinitis due to animal (cat) (dog) hair and dander: Secondary | ICD-10-CM | POA: Diagnosis not present

## 2018-09-06 DIAGNOSIS — J301 Allergic rhinitis due to pollen: Secondary | ICD-10-CM | POA: Diagnosis not present

## 2018-09-06 DIAGNOSIS — J3089 Other allergic rhinitis: Secondary | ICD-10-CM | POA: Diagnosis not present

## 2018-09-12 DIAGNOSIS — J3081 Allergic rhinitis due to animal (cat) (dog) hair and dander: Secondary | ICD-10-CM | POA: Diagnosis not present

## 2018-09-12 DIAGNOSIS — J301 Allergic rhinitis due to pollen: Secondary | ICD-10-CM | POA: Diagnosis not present

## 2018-09-12 DIAGNOSIS — J3089 Other allergic rhinitis: Secondary | ICD-10-CM | POA: Diagnosis not present

## 2018-09-14 DIAGNOSIS — Z79899 Other long term (current) drug therapy: Secondary | ICD-10-CM | POA: Diagnosis not present

## 2018-09-14 DIAGNOSIS — Z8669 Personal history of other diseases of the nervous system and sense organs: Secondary | ICD-10-CM | POA: Diagnosis not present

## 2018-09-14 DIAGNOSIS — F338 Other recurrent depressive disorders: Secondary | ICD-10-CM | POA: Diagnosis not present

## 2018-09-14 DIAGNOSIS — E782 Mixed hyperlipidemia: Secondary | ICD-10-CM | POA: Diagnosis not present

## 2018-09-14 DIAGNOSIS — Z86010 Personal history of colonic polyps: Secondary | ICD-10-CM | POA: Diagnosis not present

## 2018-09-14 DIAGNOSIS — J3089 Other allergic rhinitis: Secondary | ICD-10-CM | POA: Diagnosis not present

## 2018-09-14 DIAGNOSIS — R3129 Other microscopic hematuria: Secondary | ICD-10-CM | POA: Diagnosis not present

## 2018-09-14 DIAGNOSIS — D649 Anemia, unspecified: Secondary | ICD-10-CM | POA: Diagnosis not present

## 2018-09-14 DIAGNOSIS — F419 Anxiety disorder, unspecified: Secondary | ICD-10-CM | POA: Diagnosis not present

## 2018-09-21 DIAGNOSIS — F419 Anxiety disorder, unspecified: Secondary | ICD-10-CM | POA: Diagnosis not present

## 2018-09-21 DIAGNOSIS — J3081 Allergic rhinitis due to animal (cat) (dog) hair and dander: Secondary | ICD-10-CM | POA: Diagnosis not present

## 2018-09-21 DIAGNOSIS — R3129 Other microscopic hematuria: Secondary | ICD-10-CM | POA: Diagnosis not present

## 2018-09-21 DIAGNOSIS — Z79899 Other long term (current) drug therapy: Secondary | ICD-10-CM | POA: Diagnosis not present

## 2018-09-21 DIAGNOSIS — J301 Allergic rhinitis due to pollen: Secondary | ICD-10-CM | POA: Diagnosis not present

## 2018-09-21 DIAGNOSIS — Z8669 Personal history of other diseases of the nervous system and sense organs: Secondary | ICD-10-CM | POA: Diagnosis not present

## 2018-09-21 DIAGNOSIS — J3089 Other allergic rhinitis: Secondary | ICD-10-CM | POA: Diagnosis not present

## 2018-09-21 DIAGNOSIS — Z86010 Personal history of colonic polyps: Secondary | ICD-10-CM | POA: Diagnosis not present

## 2018-09-21 DIAGNOSIS — F338 Other recurrent depressive disorders: Secondary | ICD-10-CM | POA: Diagnosis not present

## 2018-09-21 DIAGNOSIS — D649 Anemia, unspecified: Secondary | ICD-10-CM | POA: Diagnosis not present

## 2018-09-21 DIAGNOSIS — E782 Mixed hyperlipidemia: Secondary | ICD-10-CM | POA: Diagnosis not present

## 2018-10-07 DIAGNOSIS — J3089 Other allergic rhinitis: Secondary | ICD-10-CM | POA: Diagnosis not present

## 2018-10-07 DIAGNOSIS — J301 Allergic rhinitis due to pollen: Secondary | ICD-10-CM | POA: Diagnosis not present

## 2018-10-07 DIAGNOSIS — J3081 Allergic rhinitis due to animal (cat) (dog) hair and dander: Secondary | ICD-10-CM | POA: Diagnosis not present

## 2018-10-14 DIAGNOSIS — J3081 Allergic rhinitis due to animal (cat) (dog) hair and dander: Secondary | ICD-10-CM | POA: Diagnosis not present

## 2018-10-14 DIAGNOSIS — J301 Allergic rhinitis due to pollen: Secondary | ICD-10-CM | POA: Diagnosis not present

## 2018-10-14 DIAGNOSIS — J3089 Other allergic rhinitis: Secondary | ICD-10-CM | POA: Diagnosis not present

## 2018-10-17 DIAGNOSIS — J3081 Allergic rhinitis due to animal (cat) (dog) hair and dander: Secondary | ICD-10-CM | POA: Diagnosis not present

## 2018-10-17 DIAGNOSIS — J3089 Other allergic rhinitis: Secondary | ICD-10-CM | POA: Diagnosis not present

## 2018-10-17 DIAGNOSIS — J301 Allergic rhinitis due to pollen: Secondary | ICD-10-CM | POA: Diagnosis not present

## 2018-10-21 ENCOUNTER — Other Ambulatory Visit: Payer: Self-pay | Admitting: Family Medicine

## 2018-10-21 DIAGNOSIS — J301 Allergic rhinitis due to pollen: Secondary | ICD-10-CM | POA: Diagnosis not present

## 2018-10-21 DIAGNOSIS — Z1231 Encounter for screening mammogram for malignant neoplasm of breast: Secondary | ICD-10-CM

## 2018-10-24 DIAGNOSIS — J3089 Other allergic rhinitis: Secondary | ICD-10-CM | POA: Diagnosis not present

## 2018-10-24 DIAGNOSIS — J3081 Allergic rhinitis due to animal (cat) (dog) hair and dander: Secondary | ICD-10-CM | POA: Diagnosis not present

## 2018-10-31 MED FILL — SERTRALINE HCL 100 MG TAB: 100 | 90 days supply | Qty: 90 | Fill #0

## 2018-10-31 MED FILL — ROSUVASTATIN CALCIUM 10 MG: 10 | 90 days supply | Qty: 90 | Fill #0

## 2018-10-31 MED FILL — LEVOCETIRIZINE 5 MG TABLET: 5 | 90 days supply | Qty: 90 | Fill #0

## 2018-11-01 DIAGNOSIS — J3081 Allergic rhinitis due to animal (cat) (dog) hair and dander: Secondary | ICD-10-CM | POA: Diagnosis not present

## 2018-11-01 DIAGNOSIS — J3089 Other allergic rhinitis: Secondary | ICD-10-CM | POA: Diagnosis not present

## 2018-11-01 DIAGNOSIS — J301 Allergic rhinitis due to pollen: Secondary | ICD-10-CM | POA: Diagnosis not present

## 2018-11-15 DIAGNOSIS — J3089 Other allergic rhinitis: Secondary | ICD-10-CM | POA: Diagnosis not present

## 2018-11-15 DIAGNOSIS — J301 Allergic rhinitis due to pollen: Secondary | ICD-10-CM | POA: Diagnosis not present

## 2018-11-15 DIAGNOSIS — J3081 Allergic rhinitis due to animal (cat) (dog) hair and dander: Secondary | ICD-10-CM | POA: Diagnosis not present

## 2018-11-28 DIAGNOSIS — J301 Allergic rhinitis due to pollen: Secondary | ICD-10-CM | POA: Diagnosis not present

## 2018-11-28 DIAGNOSIS — J3089 Other allergic rhinitis: Secondary | ICD-10-CM | POA: Diagnosis not present

## 2018-11-28 DIAGNOSIS — J3081 Allergic rhinitis due to animal (cat) (dog) hair and dander: Secondary | ICD-10-CM | POA: Diagnosis not present

## 2018-12-05 MED FILL — TOPIRAMATE 50 MG TABLET: 50 | 90 days supply | Qty: 90 | Fill #0

## 2018-12-06 DIAGNOSIS — J3081 Allergic rhinitis due to animal (cat) (dog) hair and dander: Secondary | ICD-10-CM | POA: Diagnosis not present

## 2018-12-06 DIAGNOSIS — J3089 Other allergic rhinitis: Secondary | ICD-10-CM | POA: Diagnosis not present

## 2018-12-06 DIAGNOSIS — D649 Anemia, unspecified: Secondary | ICD-10-CM | POA: Diagnosis not present

## 2018-12-06 DIAGNOSIS — J301 Allergic rhinitis due to pollen: Secondary | ICD-10-CM | POA: Diagnosis not present

## 2018-12-12 ENCOUNTER — Ambulatory Visit: Payer: 59

## 2018-12-23 DIAGNOSIS — J3081 Allergic rhinitis due to animal (cat) (dog) hair and dander: Secondary | ICD-10-CM | POA: Diagnosis not present

## 2018-12-23 DIAGNOSIS — J3089 Other allergic rhinitis: Secondary | ICD-10-CM | POA: Diagnosis not present

## 2018-12-23 DIAGNOSIS — J301 Allergic rhinitis due to pollen: Secondary | ICD-10-CM | POA: Diagnosis not present

## 2019-01-17 DIAGNOSIS — J301 Allergic rhinitis due to pollen: Secondary | ICD-10-CM | POA: Diagnosis not present

## 2019-01-17 DIAGNOSIS — J3089 Other allergic rhinitis: Secondary | ICD-10-CM | POA: Diagnosis not present

## 2019-01-25 ENCOUNTER — Ambulatory Visit: Payer: 59

## 2019-01-26 ENCOUNTER — Other Ambulatory Visit: Payer: Self-pay | Admitting: Family Medicine

## 2019-01-26 DIAGNOSIS — Z1231 Encounter for screening mammogram for malignant neoplasm of breast: Secondary | ICD-10-CM

## 2019-02-06 MED FILL — SERTRALINE HCL 100 MG TAB: 100 | 90 days supply | Qty: 90 | Fill #1

## 2019-02-06 MED FILL — ROSUVASTATIN CALCIUM 10 MG: 10 | 90 days supply | Qty: 90 | Fill #1

## 2019-02-06 MED FILL — LEVOCETIRIZINE 5 MG TABLET: 5 | 90 days supply | Qty: 90 | Fill #0

## 2019-02-09 DIAGNOSIS — J3089 Other allergic rhinitis: Secondary | ICD-10-CM | POA: Diagnosis not present

## 2019-02-09 DIAGNOSIS — J301 Allergic rhinitis due to pollen: Secondary | ICD-10-CM | POA: Diagnosis not present

## 2019-02-09 DIAGNOSIS — J3081 Allergic rhinitis due to animal (cat) (dog) hair and dander: Secondary | ICD-10-CM | POA: Diagnosis not present

## 2019-02-14 DIAGNOSIS — J301 Allergic rhinitis due to pollen: Secondary | ICD-10-CM | POA: Diagnosis not present

## 2019-02-14 DIAGNOSIS — J3089 Other allergic rhinitis: Secondary | ICD-10-CM | POA: Diagnosis not present

## 2019-02-14 DIAGNOSIS — J3081 Allergic rhinitis due to animal (cat) (dog) hair and dander: Secondary | ICD-10-CM | POA: Diagnosis not present

## 2019-02-24 DIAGNOSIS — J3081 Allergic rhinitis due to animal (cat) (dog) hair and dander: Secondary | ICD-10-CM | POA: Diagnosis not present

## 2019-02-24 DIAGNOSIS — J301 Allergic rhinitis due to pollen: Secondary | ICD-10-CM | POA: Diagnosis not present

## 2019-02-24 DIAGNOSIS — J3089 Other allergic rhinitis: Secondary | ICD-10-CM | POA: Diagnosis not present

## 2019-03-01 DIAGNOSIS — J3089 Other allergic rhinitis: Secondary | ICD-10-CM | POA: Diagnosis not present

## 2019-03-01 DIAGNOSIS — J301 Allergic rhinitis due to pollen: Secondary | ICD-10-CM | POA: Diagnosis not present

## 2019-03-01 DIAGNOSIS — J3081 Allergic rhinitis due to animal (cat) (dog) hair and dander: Secondary | ICD-10-CM | POA: Diagnosis not present

## 2019-03-07 DIAGNOSIS — J3081 Allergic rhinitis due to animal (cat) (dog) hair and dander: Secondary | ICD-10-CM | POA: Diagnosis not present

## 2019-03-07 DIAGNOSIS — J3089 Other allergic rhinitis: Secondary | ICD-10-CM | POA: Diagnosis not present

## 2019-03-07 DIAGNOSIS — J301 Allergic rhinitis due to pollen: Secondary | ICD-10-CM | POA: Diagnosis not present

## 2019-03-16 ENCOUNTER — Ambulatory Visit
Admission: RE | Admit: 2019-03-16 | Discharge: 2019-03-16 | Disposition: A | Discharge status: Home / Self Care | Payer: 59 | Source: Ambulatory Visit | Attending: Family Medicine | Admitting: Family Medicine

## 2019-03-16 ENCOUNTER — Other Ambulatory Visit: Payer: Self-pay

## 2019-03-16 DIAGNOSIS — D2272 Melanocytic nevi of left lower limb, including hip: Secondary | ICD-10-CM | POA: Diagnosis not present

## 2019-03-16 DIAGNOSIS — Z808 Family history of malignant neoplasm of other organs or systems: Secondary | ICD-10-CM | POA: Diagnosis not present

## 2019-03-16 DIAGNOSIS — D225 Melanocytic nevi of trunk: Secondary | ICD-10-CM | POA: Diagnosis not present

## 2019-03-16 DIAGNOSIS — L821 Other seborrheic keratosis: Secondary | ICD-10-CM | POA: Diagnosis not present

## 2019-03-16 DIAGNOSIS — Z800 Family history of malignant neoplasm of digestive organs: Secondary | ICD-10-CM | POA: Diagnosis not present

## 2019-03-16 DIAGNOSIS — Z23 Encounter for immunization: Secondary | ICD-10-CM | POA: Diagnosis not present

## 2019-03-16 DIAGNOSIS — Z86018 Personal history of other benign neoplasm: Secondary | ICD-10-CM | POA: Diagnosis not present

## 2019-03-16 DIAGNOSIS — Z1231 Encounter for screening mammogram for malignant neoplasm of breast: Secondary | ICD-10-CM

## 2019-03-18 ENCOUNTER — Telehealth: Payer: 59 | Admitting: Family

## 2019-03-18 DIAGNOSIS — N760 Acute vaginitis: Secondary | ICD-10-CM | POA: Diagnosis not present

## 2019-03-18 MED ORDER — FLUCONAZOLE 150 MG PO TABS: 150.0000 mg | ORAL_TABLET | Freq: Once | ORAL | 0 refills | Status: AC

## 2019-03-18 NOTE — Progress Notes (Signed)

## 2019-03-21 MED FILL — TOPIRAMATE 50 MG TABLET: 50 | 90 days supply | Qty: 90 | Fill #1

## 2019-03-22 DIAGNOSIS — J4520 Mild intermittent asthma, uncomplicated: Secondary | ICD-10-CM | POA: Diagnosis not present

## 2019-03-22 DIAGNOSIS — J301 Allergic rhinitis due to pollen: Secondary | ICD-10-CM | POA: Diagnosis not present

## 2019-03-22 DIAGNOSIS — J3081 Allergic rhinitis due to animal (cat) (dog) hair and dander: Secondary | ICD-10-CM | POA: Diagnosis not present

## 2019-03-22 DIAGNOSIS — J3089 Other allergic rhinitis: Secondary | ICD-10-CM | POA: Diagnosis not present

## 2019-04-14 DIAGNOSIS — J3081 Allergic rhinitis due to animal (cat) (dog) hair and dander: Secondary | ICD-10-CM | POA: Diagnosis not present

## 2019-04-14 DIAGNOSIS — J3089 Other allergic rhinitis: Secondary | ICD-10-CM | POA: Diagnosis not present

## 2019-04-14 DIAGNOSIS — J301 Allergic rhinitis due to pollen: Secondary | ICD-10-CM | POA: Diagnosis not present

## 2019-04-20 DIAGNOSIS — J3081 Allergic rhinitis due to animal (cat) (dog) hair and dander: Secondary | ICD-10-CM | POA: Diagnosis not present

## 2019-04-20 DIAGNOSIS — J301 Allergic rhinitis due to pollen: Secondary | ICD-10-CM | POA: Diagnosis not present

## 2019-04-20 DIAGNOSIS — J3089 Other allergic rhinitis: Secondary | ICD-10-CM | POA: Diagnosis not present

## 2019-05-02 DIAGNOSIS — J301 Allergic rhinitis due to pollen: Secondary | ICD-10-CM | POA: Diagnosis not present

## 2019-05-02 DIAGNOSIS — J3081 Allergic rhinitis due to animal (cat) (dog) hair and dander: Secondary | ICD-10-CM | POA: Diagnosis not present

## 2019-05-02 DIAGNOSIS — J3089 Other allergic rhinitis: Secondary | ICD-10-CM | POA: Diagnosis not present

## 2019-05-15 MED FILL — EPINEPHRINE 0.3 MG AUTO-INJ: 0.3 | 2 days supply | Qty: 2 | Fill #0

## 2019-05-16 MED FILL — LEVOCETIRIZINE 5 MG TABLET: 5 | 90 days supply | Qty: 90 | Fill #0

## 2019-05-16 MED FILL — SERTRALINE HCL 100 MG TAB: 100 | 90 days supply | Qty: 90 | Fill #2

## 2019-05-17 DIAGNOSIS — J3089 Other allergic rhinitis: Secondary | ICD-10-CM | POA: Diagnosis not present

## 2019-05-17 DIAGNOSIS — J3081 Allergic rhinitis due to animal (cat) (dog) hair and dander: Secondary | ICD-10-CM | POA: Diagnosis not present

## 2019-05-17 DIAGNOSIS — J301 Allergic rhinitis due to pollen: Secondary | ICD-10-CM | POA: Diagnosis not present

## 2019-06-07 DIAGNOSIS — J3089 Other allergic rhinitis: Secondary | ICD-10-CM | POA: Diagnosis not present

## 2019-06-07 DIAGNOSIS — J301 Allergic rhinitis due to pollen: Secondary | ICD-10-CM | POA: Diagnosis not present

## 2019-06-07 DIAGNOSIS — J3081 Allergic rhinitis due to animal (cat) (dog) hair and dander: Secondary | ICD-10-CM | POA: Diagnosis not present

## 2019-06-14 DIAGNOSIS — J301 Allergic rhinitis due to pollen: Secondary | ICD-10-CM | POA: Diagnosis not present

## 2019-06-14 DIAGNOSIS — J3089 Other allergic rhinitis: Secondary | ICD-10-CM | POA: Diagnosis not present

## 2019-06-14 DIAGNOSIS — J3081 Allergic rhinitis due to animal (cat) (dog) hair and dander: Secondary | ICD-10-CM | POA: Diagnosis not present

## 2019-06-19 DIAGNOSIS — J3089 Other allergic rhinitis: Secondary | ICD-10-CM | POA: Diagnosis not present

## 2019-06-19 DIAGNOSIS — J301 Allergic rhinitis due to pollen: Secondary | ICD-10-CM | POA: Diagnosis not present

## 2019-06-19 DIAGNOSIS — J3081 Allergic rhinitis due to animal (cat) (dog) hair and dander: Secondary | ICD-10-CM | POA: Diagnosis not present

## 2019-06-27 DIAGNOSIS — Z23 Encounter for immunization: Secondary | ICD-10-CM | POA: Diagnosis not present

## 2019-06-30 MED FILL — TOPIRAMATE 50 MG TABLET: 50 | 90 days supply | Qty: 90 | Fill #2

## 2019-07-11 DIAGNOSIS — J3081 Allergic rhinitis due to animal (cat) (dog) hair and dander: Secondary | ICD-10-CM | POA: Diagnosis not present

## 2019-07-11 DIAGNOSIS — J3089 Other allergic rhinitis: Secondary | ICD-10-CM | POA: Diagnosis not present

## 2019-07-11 DIAGNOSIS — J301 Allergic rhinitis due to pollen: Secondary | ICD-10-CM | POA: Diagnosis not present

## 2019-07-24 DIAGNOSIS — J3081 Allergic rhinitis due to animal (cat) (dog) hair and dander: Secondary | ICD-10-CM | POA: Diagnosis not present

## 2019-07-24 DIAGNOSIS — J3089 Other allergic rhinitis: Secondary | ICD-10-CM | POA: Diagnosis not present

## 2019-07-24 DIAGNOSIS — J301 Allergic rhinitis due to pollen: Secondary | ICD-10-CM | POA: Diagnosis not present

## 2019-07-28 DIAGNOSIS — J301 Allergic rhinitis due to pollen: Secondary | ICD-10-CM | POA: Diagnosis not present

## 2019-07-28 DIAGNOSIS — J3089 Other allergic rhinitis: Secondary | ICD-10-CM | POA: Diagnosis not present

## 2019-07-28 DIAGNOSIS — J3081 Allergic rhinitis due to animal (cat) (dog) hair and dander: Secondary | ICD-10-CM | POA: Diagnosis not present

## 2019-08-03 DIAGNOSIS — J301 Allergic rhinitis due to pollen: Secondary | ICD-10-CM | POA: Diagnosis not present

## 2019-08-03 DIAGNOSIS — J3089 Other allergic rhinitis: Secondary | ICD-10-CM | POA: Diagnosis not present

## 2019-08-03 DIAGNOSIS — J3081 Allergic rhinitis due to animal (cat) (dog) hair and dander: Secondary | ICD-10-CM | POA: Diagnosis not present

## 2019-08-25 DIAGNOSIS — J301 Allergic rhinitis due to pollen: Secondary | ICD-10-CM | POA: Diagnosis not present

## 2019-08-25 DIAGNOSIS — J3089 Other allergic rhinitis: Secondary | ICD-10-CM | POA: Diagnosis not present

## 2019-08-25 DIAGNOSIS — J3081 Allergic rhinitis due to animal (cat) (dog) hair and dander: Secondary | ICD-10-CM | POA: Diagnosis not present

## 2019-08-30 DIAGNOSIS — J301 Allergic rhinitis due to pollen: Secondary | ICD-10-CM | POA: Diagnosis not present

## 2019-08-30 DIAGNOSIS — J3081 Allergic rhinitis due to animal (cat) (dog) hair and dander: Secondary | ICD-10-CM | POA: Diagnosis not present

## 2019-08-30 DIAGNOSIS — J3089 Other allergic rhinitis: Secondary | ICD-10-CM | POA: Diagnosis not present

## 2019-09-04 DIAGNOSIS — J3081 Allergic rhinitis due to animal (cat) (dog) hair and dander: Secondary | ICD-10-CM | POA: Diagnosis not present

## 2019-09-04 DIAGNOSIS — J3089 Other allergic rhinitis: Secondary | ICD-10-CM | POA: Diagnosis not present

## 2019-09-04 DIAGNOSIS — J301 Allergic rhinitis due to pollen: Secondary | ICD-10-CM | POA: Diagnosis not present

## 2019-09-11 MED FILL — SERTRALINE HCL 100 MG TAB: 100 | 90 days supply | Qty: 90 | Fill #3

## 2019-09-11 MED FILL — LEVOCETIRIZINE 5 MG TABLET: 5 | 90 days supply | Qty: 90 | Fill #1

## 2019-09-13 MED FILL — PEG-3350 SOLUTION: 420 | 1 days supply | Qty: 4000 | Fill #0

## 2019-09-18 ENCOUNTER — Other Ambulatory Visit (HOSPITAL_COMMUNITY): Payer: Self-pay | Admitting: Family Medicine

## 2019-09-20 DIAGNOSIS — J3089 Other allergic rhinitis: Secondary | ICD-10-CM | POA: Diagnosis not present

## 2019-09-20 DIAGNOSIS — J3081 Allergic rhinitis due to animal (cat) (dog) hair and dander: Secondary | ICD-10-CM | POA: Diagnosis not present

## 2019-09-20 DIAGNOSIS — D649 Anemia, unspecified: Secondary | ICD-10-CM | POA: Diagnosis not present

## 2019-09-20 DIAGNOSIS — F338 Other recurrent depressive disorders: Secondary | ICD-10-CM | POA: Diagnosis not present

## 2019-09-20 DIAGNOSIS — Z0000 Encounter for general adult medical examination without abnormal findings: Secondary | ICD-10-CM | POA: Diagnosis not present

## 2019-09-20 DIAGNOSIS — Z86010 Personal history of colonic polyps: Secondary | ICD-10-CM | POA: Diagnosis not present

## 2019-09-20 DIAGNOSIS — J301 Allergic rhinitis due to pollen: Secondary | ICD-10-CM | POA: Diagnosis not present

## 2019-09-20 DIAGNOSIS — Z87448 Personal history of other diseases of urinary system: Secondary | ICD-10-CM | POA: Diagnosis not present

## 2019-09-20 DIAGNOSIS — E782 Mixed hyperlipidemia: Secondary | ICD-10-CM | POA: Diagnosis not present

## 2019-09-20 DIAGNOSIS — Z8669 Personal history of other diseases of the nervous system and sense organs: Secondary | ICD-10-CM | POA: Diagnosis not present

## 2019-09-20 DIAGNOSIS — R8290 Unspecified abnormal findings in urine: Secondary | ICD-10-CM | POA: Diagnosis not present

## 2019-09-26 DIAGNOSIS — J301 Allergic rhinitis due to pollen: Secondary | ICD-10-CM | POA: Diagnosis not present

## 2019-09-26 DIAGNOSIS — J3089 Other allergic rhinitis: Secondary | ICD-10-CM | POA: Diagnosis not present

## 2019-09-26 DIAGNOSIS — J3081 Allergic rhinitis due to animal (cat) (dog) hair and dander: Secondary | ICD-10-CM | POA: Diagnosis not present

## 2019-10-05 DIAGNOSIS — J3089 Other allergic rhinitis: Secondary | ICD-10-CM | POA: Diagnosis not present

## 2019-10-05 DIAGNOSIS — J3081 Allergic rhinitis due to animal (cat) (dog) hair and dander: Secondary | ICD-10-CM | POA: Diagnosis not present

## 2019-10-05 DIAGNOSIS — J301 Allergic rhinitis due to pollen: Secondary | ICD-10-CM | POA: Diagnosis not present

## 2019-10-12 DIAGNOSIS — J3081 Allergic rhinitis due to animal (cat) (dog) hair and dander: Secondary | ICD-10-CM | POA: Diagnosis not present

## 2019-10-12 DIAGNOSIS — J3089 Other allergic rhinitis: Secondary | ICD-10-CM | POA: Diagnosis not present

## 2019-10-12 DIAGNOSIS — J301 Allergic rhinitis due to pollen: Secondary | ICD-10-CM | POA: Diagnosis not present

## 2019-10-17 DIAGNOSIS — J301 Allergic rhinitis due to pollen: Secondary | ICD-10-CM | POA: Diagnosis not present

## 2019-10-17 DIAGNOSIS — J3081 Allergic rhinitis due to animal (cat) (dog) hair and dander: Secondary | ICD-10-CM | POA: Diagnosis not present

## 2019-10-17 DIAGNOSIS — J3089 Other allergic rhinitis: Secondary | ICD-10-CM | POA: Diagnosis not present

## 2019-10-25 MED FILL — TOPIRAMATE 50 MG TABLET: 50 | 90 days supply | Qty: 90 | Fill #3

## 2019-11-10 DIAGNOSIS — J301 Allergic rhinitis due to pollen: Secondary | ICD-10-CM | POA: Diagnosis not present

## 2019-11-10 DIAGNOSIS — J3081 Allergic rhinitis due to animal (cat) (dog) hair and dander: Secondary | ICD-10-CM | POA: Diagnosis not present

## 2019-11-10 DIAGNOSIS — J3089 Other allergic rhinitis: Secondary | ICD-10-CM | POA: Diagnosis not present

## 2019-11-14 DIAGNOSIS — J3081 Allergic rhinitis due to animal (cat) (dog) hair and dander: Secondary | ICD-10-CM | POA: Diagnosis not present

## 2019-11-14 DIAGNOSIS — J301 Allergic rhinitis due to pollen: Secondary | ICD-10-CM | POA: Diagnosis not present

## 2019-11-14 DIAGNOSIS — J3089 Other allergic rhinitis: Secondary | ICD-10-CM | POA: Diagnosis not present

## 2019-11-15 DIAGNOSIS — J301 Allergic rhinitis due to pollen: Secondary | ICD-10-CM | POA: Diagnosis not present

## 2019-11-16 DIAGNOSIS — J3081 Allergic rhinitis due to animal (cat) (dog) hair and dander: Secondary | ICD-10-CM | POA: Diagnosis not present

## 2019-11-16 DIAGNOSIS — J3089 Other allergic rhinitis: Secondary | ICD-10-CM | POA: Diagnosis not present

## 2019-11-20 ENCOUNTER — Other Ambulatory Visit: Payer: Self-pay

## 2019-11-20 ENCOUNTER — Ambulatory Visit (INDEPENDENT_AMBULATORY_CARE_PROVIDER_SITE_OTHER): Payer: 59 | Admitting: Nurse Practitioner

## 2019-11-20 ENCOUNTER — Encounter: Payer: Self-pay | Admitting: Nurse Practitioner

## 2019-11-20 VITALS — BP 122/80 | Ht 65.0 in | Wt 179.0 lb

## 2019-11-20 DIAGNOSIS — J3089 Other allergic rhinitis: Secondary | ICD-10-CM | POA: Diagnosis not present

## 2019-11-20 DIAGNOSIS — J3081 Allergic rhinitis due to animal (cat) (dog) hair and dander: Secondary | ICD-10-CM | POA: Diagnosis not present

## 2019-11-20 DIAGNOSIS — N951 Menopausal and female climacteric states: Secondary | ICD-10-CM

## 2019-11-20 DIAGNOSIS — Z01419 Encounter for gynecological examination (general) (routine) without abnormal findings: Secondary | ICD-10-CM

## 2019-11-20 DIAGNOSIS — J301 Allergic rhinitis due to pollen: Secondary | ICD-10-CM | POA: Diagnosis not present

## 2019-11-20 NOTE — Patient Instructions (Signed)
Health Maintenance, Female Adopting a healthy lifestyle and getting preventive care are important in promoting health and wellness. Ask your health care provider about:  The right schedule for you to have regular tests and exams.  Things you can do on your own to prevent diseases and keep yourself healthy. What should I know about diet, weight, and exercise? Eat a healthy diet   Eat a diet that includes plenty of vegetables, fruits, low-fat dairy products, and lean protein.  Do not eat a lot of foods that are high in solid fats, added sugars, or sodium. Maintain a healthy weight Body mass index (BMI) is used to identify weight problems. It estimates body fat based on height and weight. Your health care provider can help determine your BMI and help you achieve or maintain a healthy weight. Get regular exercise Get regular exercise. This is one of the most important things you can do for your health. Most adults should:  Exercise for at least 150 minutes each week. The exercise should increase your heart rate and make you sweat (moderate-intensity exercise).  Do strengthening exercises at least twice a week. This is in addition to the moderate-intensity exercise.  Spend less time sitting. Even light physical activity can be beneficial. Watch cholesterol and blood lipids Have your blood tested for lipids and cholesterol at 55 years of age, then have this test every 5 years. Have your cholesterol levels checked more often if:  Your lipid or cholesterol levels are high.  You are older than 55 years of age.  You are at high risk for heart disease. What should I know about cancer screening? Depending on your health history and family history, you may need to have cancer screening at various ages. This may include screening for:  Breast cancer.  Cervical cancer.  Colorectal cancer.  Skin cancer.  Lung cancer. What should I know about heart disease, diabetes, and high blood  pressure? Blood pressure and heart disease  High blood pressure causes heart disease and increases the risk of stroke. This is more likely to develop in people who have high blood pressure readings, are of African descent, or are overweight.  Have your blood pressure checked: ? Every 3-5 years if you are 18-39 years of age. ? Every year if you are 40 years old or older. Diabetes Have regular diabetes screenings. This checks your fasting blood sugar level. Have the screening done:  Once every three years after age 40 if you are at a normal weight and have a low risk for diabetes.  More often and at a younger age if you are overweight or have a high risk for diabetes. What should I know about preventing infection? Hepatitis B If you have a higher risk for hepatitis B, you should be screened for this virus. Talk with your health care provider to find out if you are at risk for hepatitis B infection. Hepatitis C Testing is recommended for:  Everyone born from 1945 through 1965.  Anyone with known risk factors for hepatitis C. Sexually transmitted infections (STIs)  Get screened for STIs, including gonorrhea and chlamydia, if: ? You are sexually active and are younger than 55 years of age. ? You are older than 55 years of age and your health care provider tells you that you are at risk for this type of infection. ? Your sexual activity has changed since you were last screened, and you are at increased risk for chlamydia or gonorrhea. Ask your health care provider if   you are at risk.  Ask your health care provider about whether you are at high risk for HIV. Your health care provider may recommend a prescription medicine to help prevent HIV infection. If you choose to take medicine to prevent HIV, you should first get tested for HIV. You should then be tested every 3 months for as long as you are taking the medicine. Pregnancy  If you are about to stop having your period (premenopausal) and  you may become pregnant, seek counseling before you get pregnant.  Take 400 to 800 micrograms (mcg) of folic acid every day if you become pregnant.  Ask for birth control (contraception) if you want to prevent pregnancy. Osteoporosis and menopause Osteoporosis is a disease in which the bones lose minerals and strength with aging. This can result in bone fractures. If you are 65 years old or older, or if you are at risk for osteoporosis and fractures, ask your health care provider if you should:  Be screened for bone loss.  Take a calcium or vitamin D supplement to lower your risk of fractures.  Be given hormone replacement therapy (HRT) to treat symptoms of menopause. Follow these instructions at home: Lifestyle  Do not use any products that contain nicotine or tobacco, such as cigarettes, e-cigarettes, and chewing tobacco. If you need help quitting, ask your health care provider.  Do not use street drugs.  Do not share needles.  Ask your health care provider for help if you need support or information about quitting drugs. Alcohol use  Do not drink alcohol if: ? Your health care provider tells you not to drink. ? You are pregnant, may be pregnant, or are planning to become pregnant.  If you drink alcohol: ? Limit how much you use to 0-1 drink a day. ? Limit intake if you are breastfeeding.  Be aware of how much alcohol is in your drink. In the [place] one drink equals one 12 oz bottle of beer (355 mL), one 5 oz glass of wine (148 mL), or one 1 oz glass of hard liquor (44 mL). General instructions  Schedule regular health, dental, and eye exams.  Stay current with your vaccines.  Tell your health care provider if: ? You often feel depressed. ? You have ever been abused or do not feel safe at home. Summary  Adopting a healthy lifestyle and getting preventive care are important in promoting health and wellness.  Follow your health care provider's instructions about healthy  diet, exercising, and getting tested or screened for diseases.  Follow your health care provider's instructions on monitoring your cholesterol and blood pressure. This information is not intended to replace advice given to you by your health care provider. Make sure you discuss any questions you have with your health care provider. Document Revised: 04/20/2018 Document Reviewed: 04/20/2018 Elsevier Patient Education  2020 [employer]  

## 2019-11-20 NOTE — Progress Notes (Signed)
   Susan Wagner [birth year] [id number]   History:  54 y.o. G0 presents for annual exam. Postmenopausal - no HRT, no bleeding. Complains of vaginal dryness, uses lubrication during intercourse. Normal pap and mammogram history. Primary care manages HTN, HLD, migraines, and anxiety/depression.   Gynecologic History Contraception: post menopausal status Last Pap: 02/16/2017. Results were: normal Last mammogram: 03/17/2019. Results were: normal  Last colonoscopy: 2019. Results: polyps. Repeat next week   Past medical history, past surgical history, family history and social history were all reviewed and documented in the EPIC chart.  ROS:  A ROS was performed and pertinent positives and negatives are included.  Exam:  Vitals:   11/20/19 1604  BP: 122/80  Weight: 179 lb (81.2 kg)  Height: 5\' 5"  (1.651 m)   Body mass index is 29.79 kg/m.  General appearance:  Normal Thyroid:  Symmetrical, normal in size, without palpable masses or nodularity. Respiratory  Auscultation:  Clear without wheezing or rhonchi Cardiovascular  Auscultation:  Regular rate, without rubs, murmurs or gallops  Edema/varicosities:  Not grossly evident Abdominal  Soft,nontender, without masses, guarding or rebound.  Liver/spleen:  No organomegaly noted  Hernia:  None appreciated  Skin  Inspection:  Grossly normal   Breasts: Examined lying and sitting.   Right: Without masses, retractions, discharge or axillary adenopathy.   Left: Without masses, retractions, discharge or axillary adenopathy. Gentitourinary   Inguinal/mons:  Normal without inguinal adenopathy  External genitalia:  Normal  BUS/Urethra/Skene's glands:  Normal  Vagina:  Normal, atrophy  Cervix:  Normal  Uterus:  Anteverted, normal in size, shape and contour.  Midline and mobile  Adnexa/parametria:     Rt: Without masses or tenderness.   Lt: Without masses or tenderness.  Anus and perineum: Normal  Digital rectal exam: Normal sphincter  tone without palpated masses or tenderness  Assessment/Plan:  54 y.o. G0 for annual exam.    Well female exam with routine gynecological exam - Education provided on SBEs, importance of preventative screenings, current guidelines, high calcium diet, regular exercise, and multivitamin daily.   Menopausal vaginal dryness - OTC Replens every 2-3 days, continue lubrication for intercourse.   Follow up I 1 year for annual     [place], 4:24 PM 11/20/2019

## 2019-11-22 DIAGNOSIS — Z1159 Encounter for screening for other viral diseases: Secondary | ICD-10-CM | POA: Diagnosis not present

## 2019-11-27 DIAGNOSIS — Z86010 Personal history of colonic polyps: Secondary | ICD-10-CM | POA: Diagnosis not present

## 2019-11-27 DIAGNOSIS — K5730 Diverticulosis of large intestine without perforation or abscess without bleeding: Secondary | ICD-10-CM | POA: Diagnosis not present

## 2019-11-27 DIAGNOSIS — D120 Benign neoplasm of cecum: Secondary | ICD-10-CM | POA: Diagnosis not present

## 2019-11-27 DIAGNOSIS — K6389 Other specified diseases of intestine: Secondary | ICD-10-CM | POA: Diagnosis not present

## 2019-11-27 DIAGNOSIS — K648 Other hemorrhoids: Secondary | ICD-10-CM | POA: Diagnosis not present

## 2019-11-27 DIAGNOSIS — K635 Polyp of colon: Secondary | ICD-10-CM | POA: Diagnosis not present

## 2019-12-06 DIAGNOSIS — J301 Allergic rhinitis due to pollen: Secondary | ICD-10-CM | POA: Diagnosis not present

## 2019-12-06 DIAGNOSIS — J3089 Other allergic rhinitis: Secondary | ICD-10-CM | POA: Diagnosis not present

## 2019-12-06 DIAGNOSIS — J3081 Allergic rhinitis due to animal (cat) (dog) hair and dander: Secondary | ICD-10-CM | POA: Diagnosis not present

## 2019-12-18 DIAGNOSIS — J301 Allergic rhinitis due to pollen: Secondary | ICD-10-CM | POA: Diagnosis not present

## 2019-12-18 DIAGNOSIS — J3089 Other allergic rhinitis: Secondary | ICD-10-CM | POA: Diagnosis not present

## 2019-12-18 DIAGNOSIS — J3081 Allergic rhinitis due to animal (cat) (dog) hair and dander: Secondary | ICD-10-CM | POA: Diagnosis not present

## 2019-12-22 DIAGNOSIS — J3081 Allergic rhinitis due to animal (cat) (dog) hair and dander: Secondary | ICD-10-CM | POA: Diagnosis not present

## 2019-12-22 DIAGNOSIS — J301 Allergic rhinitis due to pollen: Secondary | ICD-10-CM | POA: Diagnosis not present

## 2019-12-22 DIAGNOSIS — J3089 Other allergic rhinitis: Secondary | ICD-10-CM | POA: Diagnosis not present

## 2019-12-23 ENCOUNTER — Telehealth: Payer: 59 | Admitting: Physician Assistant

## 2019-12-23 DIAGNOSIS — N3000 Acute cystitis without hematuria: Secondary | ICD-10-CM

## 2019-12-23 MED ORDER — CEPHALEXIN 500 MG PO CAPS: 500.0000 mg | ORAL_CAPSULE | Freq: Two times a day (BID) | ORAL | 0 refills | Status: DC

## 2019-12-23 NOTE — Progress Notes (Signed)
We are sorry that you are not feeling well.  Here is how we plan to help!  Based on what you shared with me it looks like you most likely have a simple urinary tract infection.  A UTI (Urinary Tract Infection) is a bacterial infection of the bladder.  Most cases of urinary tract infections are simple to treat but a key part of your care is to encourage you to drink plenty of fluids and watch your symptoms carefully.  I have prescribed Keflex 500 mg twice a day for 5 days.  Your symptoms should gradually improve. Call us if the burning in your urine worsens, you develop worsening fever, back pain or pelvic pain or if your symptoms do not resolve after completing the antibiotic.  Urinary tract infections can be prevented by drinking plenty of water to keep your body hydrated.  Also be sure when you wipe, wipe from front to back and don't hold it in!  If possible, empty your bladder every 4 hours.  Your e-visit answers were reviewed by a board certified advanced clinical practitioner to complete your personal care plan.  Depending on the condition, your plan could have included both over the counter or prescription medications.  If there is a problem please reply  once you have received a response from your provider.  Your safety is important to [place].  If you have drug allergies check your prescription carefully.    You can use MyChart to ask questions about today's visit, request a non-urgent call back, or ask for a work or school excuse for 24 hours related to this e-Visit. If it has been greater than 24 hours you will need to follow up with your provider, or enter a new e-Visit to address those concerns.   You will get an e-mail in the next two days asking about your experience.  I hope that your e-visit has been valuable and will speed your recovery. Thank you for using e-visits.  Greater than 5 minutes, yet less than 10 minutes of time have been spent researching, coordinating, and  implementing care for this patient today

## 2019-12-25 DIAGNOSIS — J3089 Other allergic rhinitis: Secondary | ICD-10-CM | POA: Diagnosis not present

## 2019-12-25 DIAGNOSIS — J3081 Allergic rhinitis due to animal (cat) (dog) hair and dander: Secondary | ICD-10-CM | POA: Diagnosis not present

## 2019-12-25 DIAGNOSIS — J301 Allergic rhinitis due to pollen: Secondary | ICD-10-CM | POA: Diagnosis not present

## 2019-12-27 DIAGNOSIS — N390 Urinary tract infection, site not specified: Secondary | ICD-10-CM | POA: Diagnosis not present

## 2019-12-27 MED FILL — SULFAMETHOXAZOLE-TMP DS TAB: 800-160 | 3 days supply | Qty: 6 | Fill #0

## 2019-12-28 MED FILL — ROSUVASTATIN CALCIUM 10 MG: 10 | 90 days supply | Qty: 90 | Fill #1

## 2019-12-28 MED FILL — LEVOCETIRIZINE 5 MG TABLET: 5 | 90 days supply | Qty: 90 | Fill #2

## 2019-12-28 MED FILL — SUMATRIPTAN SUCCINATE 100 M: 100 | 30 days supply | Qty: 8 | Fill #0

## 2019-12-28 MED FILL — SERTRALINE HCL 100 MG TABS: 100 | 90 days supply | Qty: 135 | Fill #0

## 2019-12-29 DIAGNOSIS — J301 Allergic rhinitis due to pollen: Secondary | ICD-10-CM | POA: Diagnosis not present

## 2019-12-29 DIAGNOSIS — J3089 Other allergic rhinitis: Secondary | ICD-10-CM | POA: Diagnosis not present

## 2019-12-29 DIAGNOSIS — J3081 Allergic rhinitis due to animal (cat) (dog) hair and dander: Secondary | ICD-10-CM | POA: Diagnosis not present

## 2020-01-02 MED FILL — SULFAMETHOXAZOLE-TMP DS TAB: 800-160 | 5 days supply | Qty: 10 | Fill #0

## 2020-01-04 DIAGNOSIS — J3081 Allergic rhinitis due to animal (cat) (dog) hair and dander: Secondary | ICD-10-CM | POA: Diagnosis not present

## 2020-01-04 DIAGNOSIS — J301 Allergic rhinitis due to pollen: Secondary | ICD-10-CM | POA: Diagnosis not present

## 2020-01-04 DIAGNOSIS — J3089 Other allergic rhinitis: Secondary | ICD-10-CM | POA: Diagnosis not present

## 2020-01-25 ENCOUNTER — Other Ambulatory Visit (HOSPITAL_COMMUNITY): Payer: Self-pay | Admitting: Family Medicine

## 2020-01-25 MED FILL — TOPIRAMATE 50 MG TABLET: 50 | 90 days supply | Qty: 90 | Fill #0

## 2020-01-26 DIAGNOSIS — J301 Allergic rhinitis due to pollen: Secondary | ICD-10-CM | POA: Diagnosis not present

## 2020-01-26 DIAGNOSIS — J3081 Allergic rhinitis due to animal (cat) (dog) hair and dander: Secondary | ICD-10-CM | POA: Diagnosis not present

## 2020-01-26 DIAGNOSIS — J3089 Other allergic rhinitis: Secondary | ICD-10-CM | POA: Diagnosis not present

## 2020-02-01 DIAGNOSIS — J301 Allergic rhinitis due to pollen: Secondary | ICD-10-CM | POA: Diagnosis not present

## 2020-02-01 DIAGNOSIS — J3089 Other allergic rhinitis: Secondary | ICD-10-CM | POA: Diagnosis not present

## 2020-02-01 DIAGNOSIS — J3081 Allergic rhinitis due to animal (cat) (dog) hair and dander: Secondary | ICD-10-CM | POA: Diagnosis not present

## 2020-02-15 ENCOUNTER — Other Ambulatory Visit: Payer: Self-pay | Admitting: Family Medicine

## 2020-02-15 DIAGNOSIS — Z8669 Personal history of other diseases of the nervous system and sense organs: Secondary | ICD-10-CM | POA: Diagnosis not present

## 2020-02-15 DIAGNOSIS — R109 Unspecified abdominal pain: Secondary | ICD-10-CM | POA: Diagnosis not present

## 2020-02-15 DIAGNOSIS — N390 Urinary tract infection, site not specified: Secondary | ICD-10-CM | POA: Diagnosis not present

## 2020-02-15 DIAGNOSIS — Z0000 Encounter for general adult medical examination without abnormal findings: Secondary | ICD-10-CM | POA: Diagnosis not present

## 2020-02-15 DIAGNOSIS — F338 Other recurrent depressive disorders: Secondary | ICD-10-CM | POA: Diagnosis not present

## 2020-02-15 DIAGNOSIS — Z1159 Encounter for screening for other viral diseases: Secondary | ICD-10-CM | POA: Diagnosis not present

## 2020-02-15 DIAGNOSIS — D649 Anemia, unspecified: Secondary | ICD-10-CM | POA: Diagnosis not present

## 2020-02-15 DIAGNOSIS — R5383 Other fatigue: Secondary | ICD-10-CM | POA: Diagnosis not present

## 2020-02-15 DIAGNOSIS — J3081 Allergic rhinitis due to animal (cat) (dog) hair and dander: Secondary | ICD-10-CM | POA: Diagnosis not present

## 2020-02-15 DIAGNOSIS — J3089 Other allergic rhinitis: Secondary | ICD-10-CM | POA: Diagnosis not present

## 2020-02-15 DIAGNOSIS — E782 Mixed hyperlipidemia: Secondary | ICD-10-CM | POA: Diagnosis not present

## 2020-02-15 DIAGNOSIS — J301 Allergic rhinitis due to pollen: Secondary | ICD-10-CM | POA: Diagnosis not present

## 2020-02-15 DIAGNOSIS — Z1231 Encounter for screening mammogram for malignant neoplasm of breast: Secondary | ICD-10-CM

## 2020-02-21 DIAGNOSIS — J301 Allergic rhinitis due to pollen: Secondary | ICD-10-CM | POA: Diagnosis not present

## 2020-02-21 DIAGNOSIS — J3081 Allergic rhinitis due to animal (cat) (dog) hair and dander: Secondary | ICD-10-CM | POA: Diagnosis not present

## 2020-02-21 DIAGNOSIS — J3089 Other allergic rhinitis: Secondary | ICD-10-CM | POA: Diagnosis not present

## 2020-02-26 DIAGNOSIS — J3081 Allergic rhinitis due to animal (cat) (dog) hair and dander: Secondary | ICD-10-CM | POA: Diagnosis not present

## 2020-02-26 DIAGNOSIS — J3089 Other allergic rhinitis: Secondary | ICD-10-CM | POA: Diagnosis not present

## 2020-02-26 DIAGNOSIS — J301 Allergic rhinitis due to pollen: Secondary | ICD-10-CM | POA: Diagnosis not present

## 2020-02-27 ENCOUNTER — Other Ambulatory Visit (HOSPITAL_COMMUNITY): Payer: Self-pay

## 2020-02-29 MED FILL — PENICILLIN VK 500 MG TABLET: 500 | 8 days supply | Qty: 36 | Fill #0

## 2020-03-12 DIAGNOSIS — J3089 Other allergic rhinitis: Secondary | ICD-10-CM | POA: Diagnosis not present

## 2020-03-12 DIAGNOSIS — J3081 Allergic rhinitis due to animal (cat) (dog) hair and dander: Secondary | ICD-10-CM | POA: Diagnosis not present

## 2020-03-12 DIAGNOSIS — J301 Allergic rhinitis due to pollen: Secondary | ICD-10-CM | POA: Diagnosis not present

## 2020-03-18 ENCOUNTER — Ambulatory Visit
Admission: RE | Admit: 2020-03-18 | Discharge: 2020-03-18 | Disposition: A | Discharge status: Home / Self Care | Payer: 59 | Source: Ambulatory Visit

## 2020-03-18 ENCOUNTER — Other Ambulatory Visit: Payer: Self-pay

## 2020-03-18 DIAGNOSIS — Z1231 Encounter for screening mammogram for malignant neoplasm of breast: Secondary | ICD-10-CM | POA: Diagnosis not present

## 2020-03-29 DIAGNOSIS — J3081 Allergic rhinitis due to animal (cat) (dog) hair and dander: Secondary | ICD-10-CM | POA: Diagnosis not present

## 2020-03-29 DIAGNOSIS — J3089 Other allergic rhinitis: Secondary | ICD-10-CM | POA: Diagnosis not present

## 2020-03-29 DIAGNOSIS — J301 Allergic rhinitis due to pollen: Secondary | ICD-10-CM | POA: Diagnosis not present

## 2020-04-02 DIAGNOSIS — J3089 Other allergic rhinitis: Secondary | ICD-10-CM | POA: Diagnosis not present

## 2020-04-02 DIAGNOSIS — J3081 Allergic rhinitis due to animal (cat) (dog) hair and dander: Secondary | ICD-10-CM | POA: Diagnosis not present

## 2020-04-02 DIAGNOSIS — J301 Allergic rhinitis due to pollen: Secondary | ICD-10-CM | POA: Diagnosis not present

## 2020-04-08 ENCOUNTER — Other Ambulatory Visit (HOSPITAL_COMMUNITY): Payer: Self-pay | Admitting: Family Medicine

## 2020-04-08 MED FILL — LEVOCETIRIZINE 5 MG TABLET: 5 | 90 days supply | Qty: 90 | Fill #0

## 2020-04-08 MED FILL — ROSUVASTATIN CALCIUM 10 MG: 10 | 90 days supply | Qty: 90 | Fill #2

## 2020-04-08 MED FILL — SERTRALINE HCL 100 MG TABS: 100 | 90 days supply | Qty: 135 | Fill #0

## 2020-04-12 DIAGNOSIS — J3081 Allergic rhinitis due to animal (cat) (dog) hair and dander: Secondary | ICD-10-CM | POA: Diagnosis not present

## 2020-04-12 DIAGNOSIS — J3089 Other allergic rhinitis: Secondary | ICD-10-CM | POA: Diagnosis not present

## 2020-04-12 DIAGNOSIS — J301 Allergic rhinitis due to pollen: Secondary | ICD-10-CM | POA: Diagnosis not present

## 2020-05-02 DIAGNOSIS — J301 Allergic rhinitis due to pollen: Secondary | ICD-10-CM | POA: Diagnosis not present

## 2020-05-02 DIAGNOSIS — J3081 Allergic rhinitis due to animal (cat) (dog) hair and dander: Secondary | ICD-10-CM | POA: Diagnosis not present

## 2020-05-02 DIAGNOSIS — J3089 Other allergic rhinitis: Secondary | ICD-10-CM | POA: Diagnosis not present

## 2020-05-20 ENCOUNTER — Other Ambulatory Visit (HOSPITAL_COMMUNITY): Payer: Self-pay | Admitting: Allergy

## 2020-05-20 MED FILL — EPINEPHRINE 0.3 MG AUTO-INJ: 0.3 | 30 days supply | Qty: 2 | Fill #0

## 2020-05-31 DIAGNOSIS — J3081 Allergic rhinitis due to animal (cat) (dog) hair and dander: Secondary | ICD-10-CM | POA: Diagnosis not present

## 2020-05-31 DIAGNOSIS — J301 Allergic rhinitis due to pollen: Secondary | ICD-10-CM | POA: Diagnosis not present

## 2020-05-31 DIAGNOSIS — J3089 Other allergic rhinitis: Secondary | ICD-10-CM | POA: Diagnosis not present

## 2020-05-31 MED FILL — TOPIRAMATE 50 MG TABLET: 50 | 90 days supply | Qty: 90 | Fill #1

## 2020-06-07 DIAGNOSIS — J3081 Allergic rhinitis due to animal (cat) (dog) hair and dander: Secondary | ICD-10-CM | POA: Diagnosis not present

## 2020-06-07 DIAGNOSIS — J301 Allergic rhinitis due to pollen: Secondary | ICD-10-CM | POA: Diagnosis not present

## 2020-06-07 DIAGNOSIS — J3089 Other allergic rhinitis: Secondary | ICD-10-CM | POA: Diagnosis not present

## 2020-06-11 DIAGNOSIS — J3081 Allergic rhinitis due to animal (cat) (dog) hair and dander: Secondary | ICD-10-CM | POA: Diagnosis not present

## 2020-06-11 DIAGNOSIS — J3089 Other allergic rhinitis: Secondary | ICD-10-CM | POA: Diagnosis not present

## 2020-06-11 DIAGNOSIS — J301 Allergic rhinitis due to pollen: Secondary | ICD-10-CM | POA: Diagnosis not present

## 2020-06-14 DIAGNOSIS — J3089 Other allergic rhinitis: Secondary | ICD-10-CM | POA: Diagnosis not present

## 2020-06-14 DIAGNOSIS — J301 Allergic rhinitis due to pollen: Secondary | ICD-10-CM | POA: Diagnosis not present

## 2020-06-14 DIAGNOSIS — J3081 Allergic rhinitis due to animal (cat) (dog) hair and dander: Secondary | ICD-10-CM | POA: Diagnosis not present

## 2020-06-17 DIAGNOSIS — J3081 Allergic rhinitis due to animal (cat) (dog) hair and dander: Secondary | ICD-10-CM | POA: Diagnosis not present

## 2020-06-17 DIAGNOSIS — J3089 Other allergic rhinitis: Secondary | ICD-10-CM | POA: Diagnosis not present

## 2020-06-17 DIAGNOSIS — J301 Allergic rhinitis due to pollen: Secondary | ICD-10-CM | POA: Diagnosis not present

## 2020-06-24 DIAGNOSIS — J301 Allergic rhinitis due to pollen: Secondary | ICD-10-CM | POA: Diagnosis not present

## 2020-06-24 DIAGNOSIS — J3089 Other allergic rhinitis: Secondary | ICD-10-CM | POA: Diagnosis not present

## 2020-06-24 DIAGNOSIS — J3081 Allergic rhinitis due to animal (cat) (dog) hair and dander: Secondary | ICD-10-CM | POA: Diagnosis not present

## 2020-07-03 DIAGNOSIS — H43813 Vitreous degeneration, bilateral: Secondary | ICD-10-CM | POA: Diagnosis not present

## 2020-07-15 DIAGNOSIS — J3089 Other allergic rhinitis: Secondary | ICD-10-CM | POA: Diagnosis not present

## 2020-07-15 DIAGNOSIS — J3081 Allergic rhinitis due to animal (cat) (dog) hair and dander: Secondary | ICD-10-CM | POA: Diagnosis not present

## 2020-07-15 DIAGNOSIS — J301 Allergic rhinitis due to pollen: Secondary | ICD-10-CM | POA: Diagnosis not present

## 2020-07-19 ENCOUNTER — Other Ambulatory Visit (HOSPITAL_COMMUNITY): Payer: Self-pay | Admitting: Allergy

## 2020-07-19 DIAGNOSIS — J301 Allergic rhinitis due to pollen: Secondary | ICD-10-CM | POA: Diagnosis not present

## 2020-07-19 DIAGNOSIS — J3089 Other allergic rhinitis: Secondary | ICD-10-CM | POA: Diagnosis not present

## 2020-07-19 DIAGNOSIS — J4520 Mild intermittent asthma, uncomplicated: Secondary | ICD-10-CM | POA: Diagnosis not present

## 2020-07-19 DIAGNOSIS — J3081 Allergic rhinitis due to animal (cat) (dog) hair and dander: Secondary | ICD-10-CM | POA: Diagnosis not present

## 2020-07-19 MED FILL — LEVOCETIRIZINE 5 MG TABLET: 5 | 90 days supply | Qty: 90 | Fill #0

## 2020-07-29 ENCOUNTER — Other Ambulatory Visit (HOSPITAL_COMMUNITY): Payer: Self-pay | Admitting: Family Medicine

## 2020-07-29 DIAGNOSIS — S46812A Strain of other muscles, fascia and tendons at shoulder and upper arm level, left arm, initial encounter: Secondary | ICD-10-CM | POA: Diagnosis not present

## 2020-08-03 MED FILL — NAPROXEN 500 MG TABS: 500 | 10 days supply | Qty: 20 | Fill #0

## 2020-08-03 MED FILL — CYCLOBENZAPRINE HCL 5 MG TA: 5 | 7 days supply | Qty: 21 | Fill #0

## 2020-08-09 DIAGNOSIS — J3081 Allergic rhinitis due to animal (cat) (dog) hair and dander: Secondary | ICD-10-CM | POA: Diagnosis not present

## 2020-08-09 DIAGNOSIS — J301 Allergic rhinitis due to pollen: Secondary | ICD-10-CM | POA: Diagnosis not present

## 2020-08-09 DIAGNOSIS — J3089 Other allergic rhinitis: Secondary | ICD-10-CM | POA: Diagnosis not present

## 2020-08-10 ENCOUNTER — Other Ambulatory Visit (HOSPITAL_COMMUNITY): Payer: Self-pay

## 2020-08-10 MED FILL — Rosuvastatin Calcium Tab 10 MG: ORAL | 90 days supply | Qty: 90 | Fill #0 | Status: AC

## 2020-08-11 ENCOUNTER — Other Ambulatory Visit (HOSPITAL_COMMUNITY): Payer: Self-pay

## 2020-08-12 ENCOUNTER — Other Ambulatory Visit (HOSPITAL_COMMUNITY): Payer: Self-pay

## 2020-08-12 MED ORDER — SERTRALINE HCL 100 MG PO TABS
ORAL_TABLET | ORAL | 2 refills | Status: DC
  Filled 2020-08-12: qty 135, 90d supply, fill #0
  Filled 2020-11-11: qty 135, 90d supply, fill #1
  Filled 2021-03-21: qty 135, 90d supply, fill #2

## 2020-08-12 MED ORDER — LEVOCETIRIZINE DIHYDROCHLORIDE 5 MG PO TABS
5.0000 mg | ORAL_TABLET | Freq: Every day | ORAL | 2 refills | Status: DC
  Filled 2020-08-12: qty 90, 90d supply, fill #0
  Filled 2020-11-11: qty 90, 90d supply, fill #1

## 2020-08-14 ENCOUNTER — Other Ambulatory Visit (HOSPITAL_COMMUNITY): Payer: Self-pay

## 2020-08-21 DIAGNOSIS — J3081 Allergic rhinitis due to animal (cat) (dog) hair and dander: Secondary | ICD-10-CM | POA: Diagnosis not present

## 2020-08-21 DIAGNOSIS — J301 Allergic rhinitis due to pollen: Secondary | ICD-10-CM | POA: Diagnosis not present

## 2020-08-21 DIAGNOSIS — J3089 Other allergic rhinitis: Secondary | ICD-10-CM | POA: Diagnosis not present

## 2020-09-05 ENCOUNTER — Other Ambulatory Visit (HOSPITAL_COMMUNITY): Payer: Self-pay

## 2020-09-05 DIAGNOSIS — J3089 Other allergic rhinitis: Secondary | ICD-10-CM | POA: Diagnosis not present

## 2020-09-05 DIAGNOSIS — J3081 Allergic rhinitis due to animal (cat) (dog) hair and dander: Secondary | ICD-10-CM | POA: Diagnosis not present

## 2020-09-05 DIAGNOSIS — J301 Allergic rhinitis due to pollen: Secondary | ICD-10-CM | POA: Diagnosis not present

## 2020-09-11 DIAGNOSIS — J3089 Other allergic rhinitis: Secondary | ICD-10-CM | POA: Diagnosis not present

## 2020-09-11 DIAGNOSIS — J301 Allergic rhinitis due to pollen: Secondary | ICD-10-CM | POA: Diagnosis not present

## 2020-09-11 DIAGNOSIS — J3081 Allergic rhinitis due to animal (cat) (dog) hair and dander: Secondary | ICD-10-CM | POA: Diagnosis not present

## 2020-09-20 ENCOUNTER — Other Ambulatory Visit (HOSPITAL_COMMUNITY): Payer: Self-pay

## 2020-09-20 DIAGNOSIS — J301 Allergic rhinitis due to pollen: Secondary | ICD-10-CM | POA: Diagnosis not present

## 2020-09-20 DIAGNOSIS — J3081 Allergic rhinitis due to animal (cat) (dog) hair and dander: Secondary | ICD-10-CM | POA: Diagnosis not present

## 2020-09-20 DIAGNOSIS — J3089 Other allergic rhinitis: Secondary | ICD-10-CM | POA: Diagnosis not present

## 2020-09-20 MED FILL — Topiramate Tab 50 MG: ORAL | 90 days supply | Qty: 90 | Fill #0 | Status: AC

## 2020-09-25 DIAGNOSIS — J3081 Allergic rhinitis due to animal (cat) (dog) hair and dander: Secondary | ICD-10-CM | POA: Diagnosis not present

## 2020-09-25 DIAGNOSIS — J301 Allergic rhinitis due to pollen: Secondary | ICD-10-CM | POA: Diagnosis not present

## 2020-09-25 DIAGNOSIS — J3089 Other allergic rhinitis: Secondary | ICD-10-CM | POA: Diagnosis not present

## 2020-10-11 DIAGNOSIS — J3081 Allergic rhinitis due to animal (cat) (dog) hair and dander: Secondary | ICD-10-CM | POA: Diagnosis not present

## 2020-10-11 DIAGNOSIS — J301 Allergic rhinitis due to pollen: Secondary | ICD-10-CM | POA: Diagnosis not present

## 2020-10-11 DIAGNOSIS — J3089 Other allergic rhinitis: Secondary | ICD-10-CM | POA: Diagnosis not present

## 2020-10-16 ENCOUNTER — Other Ambulatory Visit (HOSPITAL_COMMUNITY): Payer: Self-pay

## 2020-10-16 DIAGNOSIS — J301 Allergic rhinitis due to pollen: Secondary | ICD-10-CM | POA: Diagnosis not present

## 2020-10-16 DIAGNOSIS — J3089 Other allergic rhinitis: Secondary | ICD-10-CM | POA: Diagnosis not present

## 2020-10-16 DIAGNOSIS — J3081 Allergic rhinitis due to animal (cat) (dog) hair and dander: Secondary | ICD-10-CM | POA: Diagnosis not present

## 2020-10-16 MED ORDER — AMOXICILLIN 500 MG PO CAPS
ORAL_CAPSULE | ORAL | 1 refills | Status: DC
  Filled 2020-10-16: qty 21, 7d supply, fill #0
  Filled 2020-10-25 – 2020-11-05 (×2): qty 21, 7d supply, fill #1

## 2020-10-16 MED ORDER — CHLORHEXIDINE GLUCONATE 0.12 % MT SOLN
OROMUCOSAL | 0 refills | Status: DC
  Filled 2020-10-16: qty 473, 15d supply, fill #0

## 2020-10-25 ENCOUNTER — Other Ambulatory Visit (HOSPITAL_COMMUNITY): Payer: Self-pay

## 2020-11-05 ENCOUNTER — Other Ambulatory Visit (HOSPITAL_COMMUNITY): Payer: Self-pay

## 2020-11-06 ENCOUNTER — Other Ambulatory Visit (HOSPITAL_COMMUNITY): Payer: Self-pay

## 2020-11-08 ENCOUNTER — Other Ambulatory Visit (HOSPITAL_COMMUNITY): Payer: Self-pay

## 2020-11-08 DIAGNOSIS — J3081 Allergic rhinitis due to animal (cat) (dog) hair and dander: Secondary | ICD-10-CM | POA: Diagnosis not present

## 2020-11-08 DIAGNOSIS — J3089 Other allergic rhinitis: Secondary | ICD-10-CM | POA: Diagnosis not present

## 2020-11-08 DIAGNOSIS — J301 Allergic rhinitis due to pollen: Secondary | ICD-10-CM | POA: Diagnosis not present

## 2020-11-11 ENCOUNTER — Other Ambulatory Visit (HOSPITAL_COMMUNITY): Payer: Self-pay

## 2020-11-12 ENCOUNTER — Other Ambulatory Visit (HOSPITAL_COMMUNITY): Payer: Self-pay

## 2020-11-13 ENCOUNTER — Other Ambulatory Visit (HOSPITAL_COMMUNITY): Payer: Self-pay

## 2020-11-13 MED ORDER — ROSUVASTATIN CALCIUM 10 MG PO TABS
10.0000 mg | ORAL_TABLET | Freq: Every day | ORAL | 1 refills | Status: DC
  Filled 2020-11-13: qty 90, 90d supply, fill #0
  Filled 2021-03-21: qty 90, 90d supply, fill #1

## 2020-11-18 DIAGNOSIS — J3081 Allergic rhinitis due to animal (cat) (dog) hair and dander: Secondary | ICD-10-CM | POA: Diagnosis not present

## 2020-11-18 DIAGNOSIS — J301 Allergic rhinitis due to pollen: Secondary | ICD-10-CM | POA: Diagnosis not present

## 2020-11-18 DIAGNOSIS — J3089 Other allergic rhinitis: Secondary | ICD-10-CM | POA: Diagnosis not present

## 2020-11-20 ENCOUNTER — Other Ambulatory Visit: Payer: Self-pay

## 2020-11-20 ENCOUNTER — Ambulatory Visit (INDEPENDENT_AMBULATORY_CARE_PROVIDER_SITE_OTHER): Payer: 59 | Admitting: Nurse Practitioner

## 2020-11-20 ENCOUNTER — Encounter: Payer: Self-pay | Admitting: Nurse Practitioner

## 2020-11-20 VITALS — BP 138/88 | HR 86 | Ht 64.0 in | Wt 180.0 lb

## 2020-11-20 DIAGNOSIS — Z780 Asymptomatic menopausal state: Secondary | ICD-10-CM

## 2020-11-20 DIAGNOSIS — Z01419 Encounter for gynecological examination (general) (routine) without abnormal findings: Secondary | ICD-10-CM

## 2020-11-20 NOTE — Progress Notes (Signed)
   Susan Wagner 07-13-1965 [id number]   History:  55 y.o. G0 presents for annual exam without GYN complaints. Postmenopausal - no HRT, no bleeding. Normal pap and mammogram history. Primary care manages HTN, HLD, migraines, and anxiety/depression.   Gynecologic History Contraception: post menopausal status Last Pap: 02/16/2017. Results were: normal Last mammogram: 03/18/2020. Results were: normal  Last colonoscopy: 10/2019. Results: Normal, 5-year recall Last Dexa: > 10 years ago per patient  Past medical history, past surgical history, family history and social history were all reviewed and documented in the EPIC chart. 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   ROS:  A ROS was performed and pertinent positives and negatives are included.  Exam:  Vitals:   11/20/20 1606  BP: 138/88  Pulse: 86  SpO2: 99%  Weight: 180 lb (81.6 kg)  Height: 5\' 4"  (1.626 m)    Body mass index is 30.9 kg/m.  General appearance:  Normal Thyroid:  Symmetrical, normal in size, without palpable masses or nodularity. Respiratory  Auscultation:  Clear without wheezing or rhonchi Cardiovascular  Auscultation:  Regular rate, without rubs, murmurs or gallops  Edema/varicosities:  Not grossly evident Abdominal  Soft,nontender, without masses, guarding or rebound.  Liver/spleen:  No organomegaly noted  Hernia:  None appreciated  Skin  Inspection:  Grossly normal   Breasts: Examined lying and sitting.   Right: Without masses, retractions, discharge or axillary adenopathy.   Left: Without masses, retractions, discharge or axillary adenopathy. Gentitourinary   Inguinal/mons:  Normal without inguinal adenopathy  External genitalia:  Normal  BUS/Urethra/Skene's glands:  Normal  Vagina:  Normal, atrophic changes  Cervix:  Normal  Uterus:  Normal in size, shape and contour.  Midline and mobile  Adnexa/parametria:     Rt: Without masses or tenderness.   Lt: Without masses or tenderness.  Anus and  perineum: Normal  Digital rectal exam: Normal sphincter tone without palpated masses or tenderness  Assessment/Plan:  55 y.o. G0 for annual exam.    Well female exam with routine gynecological exam - Education provided on SBEs, importance of preventative screenings, current guidelines, high calcium diet, regular exercise, and multivitamin daily. Labs with PCP.   Postmenopausal - no HRT, no bleeding.   Screening for cervical cancer - Normal Pap history.  Will repeat at 5-year interval per guidelines.  Screening for breast cancer - Normal mammogram history.  Continue annual screenings.  Normal breast exam today.  Screening for colon cancer - 10/2019 colonoscopy. Will repeat at GI's recommended interval.   Follow up in 1 year for annual     [place], 4:23 PM 11/20/2020

## 2020-11-29 DIAGNOSIS — J3081 Allergic rhinitis due to animal (cat) (dog) hair and dander: Secondary | ICD-10-CM | POA: Diagnosis not present

## 2020-11-29 DIAGNOSIS — J301 Allergic rhinitis due to pollen: Secondary | ICD-10-CM | POA: Diagnosis not present

## 2020-11-29 DIAGNOSIS — J3089 Other allergic rhinitis: Secondary | ICD-10-CM | POA: Diagnosis not present

## 2020-12-10 DIAGNOSIS — J301 Allergic rhinitis due to pollen: Secondary | ICD-10-CM | POA: Diagnosis not present

## 2020-12-10 DIAGNOSIS — J3089 Other allergic rhinitis: Secondary | ICD-10-CM | POA: Diagnosis not present

## 2020-12-10 DIAGNOSIS — J3081 Allergic rhinitis due to animal (cat) (dog) hair and dander: Secondary | ICD-10-CM | POA: Diagnosis not present

## 2020-12-27 DIAGNOSIS — J3081 Allergic rhinitis due to animal (cat) (dog) hair and dander: Secondary | ICD-10-CM | POA: Diagnosis not present

## 2020-12-27 DIAGNOSIS — J301 Allergic rhinitis due to pollen: Secondary | ICD-10-CM | POA: Diagnosis not present

## 2020-12-27 DIAGNOSIS — J3089 Other allergic rhinitis: Secondary | ICD-10-CM | POA: Diagnosis not present

## 2021-01-03 DIAGNOSIS — J301 Allergic rhinitis due to pollen: Secondary | ICD-10-CM | POA: Diagnosis not present

## 2021-01-03 DIAGNOSIS — J3089 Other allergic rhinitis: Secondary | ICD-10-CM | POA: Diagnosis not present

## 2021-01-03 DIAGNOSIS — J3081 Allergic rhinitis due to animal (cat) (dog) hair and dander: Secondary | ICD-10-CM | POA: Diagnosis not present

## 2021-01-13 ENCOUNTER — Other Ambulatory Visit (HOSPITAL_COMMUNITY): Payer: Self-pay

## 2021-01-14 ENCOUNTER — Other Ambulatory Visit (HOSPITAL_COMMUNITY): Payer: Self-pay

## 2021-01-14 MED ORDER — TOPIRAMATE 50 MG PO TABS
50.0000 mg | ORAL_TABLET | Freq: Every day | ORAL | 3 refills | Status: DC
  Filled 2021-01-14: qty 90, 90d supply, fill #0
  Filled 2021-05-09: qty 90, 90d supply, fill #1
  Filled 2021-08-22: qty 90, 90d supply, fill #2
  Filled 2021-11-20: qty 90, 90d supply, fill #3

## 2021-01-16 ENCOUNTER — Other Ambulatory Visit (HOSPITAL_COMMUNITY): Payer: Self-pay

## 2021-01-16 DIAGNOSIS — G43109 Migraine with aura, not intractable, without status migrainosus: Secondary | ICD-10-CM | POA: Diagnosis not present

## 2021-01-16 DIAGNOSIS — J3089 Other allergic rhinitis: Secondary | ICD-10-CM | POA: Diagnosis not present

## 2021-01-16 DIAGNOSIS — M25512 Pain in left shoulder: Secondary | ICD-10-CM | POA: Diagnosis not present

## 2021-01-16 DIAGNOSIS — F329 Major depressive disorder, single episode, unspecified: Secondary | ICD-10-CM | POA: Diagnosis not present

## 2021-01-16 DIAGNOSIS — Z0000 Encounter for general adult medical examination without abnormal findings: Secondary | ICD-10-CM | POA: Diagnosis not present

## 2021-01-16 DIAGNOSIS — J301 Allergic rhinitis due to pollen: Secondary | ICD-10-CM | POA: Diagnosis not present

## 2021-01-16 DIAGNOSIS — G8929 Other chronic pain: Secondary | ICD-10-CM | POA: Diagnosis not present

## 2021-01-16 DIAGNOSIS — E782 Mixed hyperlipidemia: Secondary | ICD-10-CM | POA: Diagnosis not present

## 2021-01-16 DIAGNOSIS — Z8669 Personal history of other diseases of the nervous system and sense organs: Secondary | ICD-10-CM | POA: Diagnosis not present

## 2021-01-16 DIAGNOSIS — J3081 Allergic rhinitis due to animal (cat) (dog) hair and dander: Secondary | ICD-10-CM | POA: Diagnosis not present

## 2021-01-16 MED ORDER — ZOLMITRIPTAN 5 MG PO TABS
ORAL_TABLET | ORAL | 2 refills | Status: DC
  Filled 2021-01-16 – 2021-01-23 (×2): qty 12, 30d supply, fill #0

## 2021-01-17 ENCOUNTER — Other Ambulatory Visit (HOSPITAL_COMMUNITY): Payer: Self-pay

## 2021-01-20 ENCOUNTER — Other Ambulatory Visit (HOSPITAL_COMMUNITY): Payer: Self-pay

## 2021-01-20 MED ORDER — FENOFIBRATE 48 MG PO TABS
ORAL_TABLET | ORAL | 1 refills | Status: DC
  Filled 2021-01-20: qty 90, 90d supply, fill #0

## 2021-01-23 ENCOUNTER — Other Ambulatory Visit (HOSPITAL_COMMUNITY): Payer: Self-pay

## 2021-01-24 ENCOUNTER — Other Ambulatory Visit (HOSPITAL_COMMUNITY): Payer: Self-pay

## 2021-01-27 ENCOUNTER — Other Ambulatory Visit (HOSPITAL_COMMUNITY): Payer: Self-pay

## 2021-01-31 DIAGNOSIS — J301 Allergic rhinitis due to pollen: Secondary | ICD-10-CM | POA: Diagnosis not present

## 2021-01-31 DIAGNOSIS — J3089 Other allergic rhinitis: Secondary | ICD-10-CM | POA: Diagnosis not present

## 2021-01-31 DIAGNOSIS — J3081 Allergic rhinitis due to animal (cat) (dog) hair and dander: Secondary | ICD-10-CM | POA: Diagnosis not present

## 2021-02-04 ENCOUNTER — Other Ambulatory Visit: Payer: Self-pay | Admitting: Family Medicine

## 2021-02-04 DIAGNOSIS — Z1231 Encounter for screening mammogram for malignant neoplasm of breast: Secondary | ICD-10-CM

## 2021-02-11 DIAGNOSIS — J3089 Other allergic rhinitis: Secondary | ICD-10-CM | POA: Diagnosis not present

## 2021-02-11 DIAGNOSIS — J301 Allergic rhinitis due to pollen: Secondary | ICD-10-CM | POA: Diagnosis not present

## 2021-02-11 DIAGNOSIS — J3081 Allergic rhinitis due to animal (cat) (dog) hair and dander: Secondary | ICD-10-CM | POA: Diagnosis not present

## 2021-02-12 DIAGNOSIS — G514 Facial myokymia: Secondary | ICD-10-CM | POA: Diagnosis not present

## 2021-02-13 DIAGNOSIS — M25512 Pain in left shoulder: Secondary | ICD-10-CM | POA: Diagnosis not present

## 2021-03-18 DIAGNOSIS — L821 Other seborrheic keratosis: Secondary | ICD-10-CM | POA: Diagnosis not present

## 2021-03-18 DIAGNOSIS — L219 Seborrheic dermatitis, unspecified: Secondary | ICD-10-CM | POA: Diagnosis not present

## 2021-03-18 DIAGNOSIS — Z23 Encounter for immunization: Secondary | ICD-10-CM | POA: Diagnosis not present

## 2021-03-18 DIAGNOSIS — D225 Melanocytic nevi of trunk: Secondary | ICD-10-CM | POA: Diagnosis not present

## 2021-03-18 DIAGNOSIS — Z808 Family history of malignant neoplasm of other organs or systems: Secondary | ICD-10-CM | POA: Diagnosis not present

## 2021-03-18 DIAGNOSIS — D2272 Melanocytic nevi of left lower limb, including hip: Secondary | ICD-10-CM | POA: Diagnosis not present

## 2021-03-18 DIAGNOSIS — L578 Other skin changes due to chronic exposure to nonionizing radiation: Secondary | ICD-10-CM | POA: Diagnosis not present

## 2021-03-18 DIAGNOSIS — Z86018 Personal history of other benign neoplasm: Secondary | ICD-10-CM | POA: Diagnosis not present

## 2021-03-18 DIAGNOSIS — Z800 Family history of malignant neoplasm of digestive organs: Secondary | ICD-10-CM | POA: Diagnosis not present

## 2021-03-21 ENCOUNTER — Other Ambulatory Visit (HOSPITAL_COMMUNITY): Payer: Self-pay

## 2021-03-24 ENCOUNTER — Ambulatory Visit
Admission: RE | Admit: 2021-03-24 | Discharge: 2021-03-24 | Disposition: A | Discharge status: Home / Self Care | Payer: 59 | Source: Ambulatory Visit

## 2021-03-24 DIAGNOSIS — Z1231 Encounter for screening mammogram for malignant neoplasm of breast: Secondary | ICD-10-CM

## 2021-04-30 DIAGNOSIS — M25512 Pain in left shoulder: Secondary | ICD-10-CM | POA: Diagnosis not present

## 2021-05-01 DIAGNOSIS — R293 Abnormal posture: Secondary | ICD-10-CM | POA: Diagnosis not present

## 2021-05-01 DIAGNOSIS — M25512 Pain in left shoulder: Secondary | ICD-10-CM | POA: Diagnosis not present

## 2021-05-01 DIAGNOSIS — M25612 Stiffness of left shoulder, not elsewhere classified: Secondary | ICD-10-CM | POA: Diagnosis not present

## 2021-05-01 DIAGNOSIS — R531 Weakness: Secondary | ICD-10-CM | POA: Diagnosis not present

## 2021-05-06 DIAGNOSIS — M25612 Stiffness of left shoulder, not elsewhere classified: Secondary | ICD-10-CM | POA: Diagnosis not present

## 2021-05-06 DIAGNOSIS — R531 Weakness: Secondary | ICD-10-CM | POA: Diagnosis not present

## 2021-05-06 DIAGNOSIS — R293 Abnormal posture: Secondary | ICD-10-CM | POA: Diagnosis not present

## 2021-05-06 DIAGNOSIS — M25512 Pain in left shoulder: Secondary | ICD-10-CM | POA: Diagnosis not present

## 2021-05-08 DIAGNOSIS — R531 Weakness: Secondary | ICD-10-CM | POA: Diagnosis not present

## 2021-05-08 DIAGNOSIS — M25612 Stiffness of left shoulder, not elsewhere classified: Secondary | ICD-10-CM | POA: Diagnosis not present

## 2021-05-08 DIAGNOSIS — M25512 Pain in left shoulder: Secondary | ICD-10-CM | POA: Diagnosis not present

## 2021-05-08 DIAGNOSIS — R293 Abnormal posture: Secondary | ICD-10-CM | POA: Diagnosis not present

## 2021-05-09 ENCOUNTER — Other Ambulatory Visit (HOSPITAL_COMMUNITY): Payer: Self-pay

## 2021-05-09 MED ORDER — LEVOCETIRIZINE DIHYDROCHLORIDE 5 MG PO TABS
ORAL_TABLET | ORAL | 0 refills | Status: DC
  Filled 2021-05-09: qty 90, 90d supply, fill #0

## 2021-05-20 DIAGNOSIS — R531 Weakness: Secondary | ICD-10-CM | POA: Diagnosis not present

## 2021-05-20 DIAGNOSIS — R293 Abnormal posture: Secondary | ICD-10-CM | POA: Diagnosis not present

## 2021-05-20 DIAGNOSIS — M25512 Pain in left shoulder: Secondary | ICD-10-CM | POA: Diagnosis not present

## 2021-05-20 DIAGNOSIS — M25612 Stiffness of left shoulder, not elsewhere classified: Secondary | ICD-10-CM | POA: Diagnosis not present

## 2021-05-23 DIAGNOSIS — M25512 Pain in left shoulder: Secondary | ICD-10-CM | POA: Diagnosis not present

## 2021-05-23 DIAGNOSIS — R293 Abnormal posture: Secondary | ICD-10-CM | POA: Diagnosis not present

## 2021-05-23 DIAGNOSIS — M25612 Stiffness of left shoulder, not elsewhere classified: Secondary | ICD-10-CM | POA: Diagnosis not present

## 2021-05-23 DIAGNOSIS — R531 Weakness: Secondary | ICD-10-CM | POA: Diagnosis not present

## 2021-06-02 DIAGNOSIS — M25512 Pain in left shoulder: Secondary | ICD-10-CM | POA: Diagnosis not present

## 2021-06-05 DIAGNOSIS — R531 Weakness: Secondary | ICD-10-CM | POA: Diagnosis not present

## 2021-06-05 DIAGNOSIS — M25512 Pain in left shoulder: Secondary | ICD-10-CM | POA: Diagnosis not present

## 2021-06-05 DIAGNOSIS — R293 Abnormal posture: Secondary | ICD-10-CM | POA: Diagnosis not present

## 2021-06-05 DIAGNOSIS — M25612 Stiffness of left shoulder, not elsewhere classified: Secondary | ICD-10-CM | POA: Diagnosis not present

## 2021-06-13 ENCOUNTER — Other Ambulatory Visit (HOSPITAL_COMMUNITY): Payer: Self-pay

## 2021-06-13 MED ORDER — EPINEPHRINE 0.3 MG/0.3ML IJ SOAJ
INTRAMUSCULAR | 1 refills | Status: DC
  Filled 2021-06-13: qty 2, 30d supply, fill #0

## 2021-06-25 ENCOUNTER — Other Ambulatory Visit (HOSPITAL_COMMUNITY): Payer: Self-pay

## 2021-06-25 DIAGNOSIS — J3081 Allergic rhinitis due to animal (cat) (dog) hair and dander: Secondary | ICD-10-CM | POA: Diagnosis not present

## 2021-06-25 DIAGNOSIS — J4520 Mild intermittent asthma, uncomplicated: Secondary | ICD-10-CM | POA: Diagnosis not present

## 2021-06-25 DIAGNOSIS — J301 Allergic rhinitis due to pollen: Secondary | ICD-10-CM | POA: Diagnosis not present

## 2021-06-25 DIAGNOSIS — J3089 Other allergic rhinitis: Secondary | ICD-10-CM | POA: Diagnosis not present

## 2021-06-25 MED ORDER — MOMETASONE FUROATE 50 MCG/ACT NA SUSP
NASAL | 6 refills | Status: AC
  Filled 2021-06-25 – 2021-07-10 (×2): qty 17, 30d supply, fill #0

## 2021-07-02 ENCOUNTER — Other Ambulatory Visit (HOSPITAL_COMMUNITY): Payer: Self-pay

## 2021-07-08 DIAGNOSIS — J301 Allergic rhinitis due to pollen: Secondary | ICD-10-CM | POA: Diagnosis not present

## 2021-07-09 DIAGNOSIS — H43813 Vitreous degeneration, bilateral: Secondary | ICD-10-CM | POA: Diagnosis not present

## 2021-07-09 DIAGNOSIS — J3089 Other allergic rhinitis: Secondary | ICD-10-CM | POA: Diagnosis not present

## 2021-07-09 DIAGNOSIS — J3081 Allergic rhinitis due to animal (cat) (dog) hair and dander: Secondary | ICD-10-CM | POA: Diagnosis not present

## 2021-07-10 ENCOUNTER — Other Ambulatory Visit (HOSPITAL_COMMUNITY): Payer: Self-pay

## 2021-07-10 MED ORDER — SERTRALINE HCL 100 MG PO TABS
150.0000 mg | ORAL_TABLET | Freq: Every day | ORAL | 2 refills | Status: DC
  Filled 2021-07-10: qty 135, 90d supply, fill #0
  Filled 2021-10-19: qty 135, 90d supply, fill #1
  Filled 2022-01-20: qty 135, 90d supply, fill #2

## 2021-07-10 MED ORDER — ROSUVASTATIN CALCIUM 10 MG PO TABS
10.0000 mg | ORAL_TABLET | Freq: Every day | ORAL | 1 refills | Status: DC
  Filled 2021-07-10: qty 90, 90d supply, fill #0
  Filled 2022-01-20: qty 90, 90d supply, fill #1

## 2021-07-16 ENCOUNTER — Other Ambulatory Visit (HOSPITAL_COMMUNITY): Payer: Self-pay

## 2021-07-17 DIAGNOSIS — J301 Allergic rhinitis due to pollen: Secondary | ICD-10-CM | POA: Diagnosis not present

## 2021-07-17 DIAGNOSIS — J3089 Other allergic rhinitis: Secondary | ICD-10-CM | POA: Diagnosis not present

## 2021-07-17 DIAGNOSIS — J3081 Allergic rhinitis due to animal (cat) (dog) hair and dander: Secondary | ICD-10-CM | POA: Diagnosis not present

## 2021-07-23 DIAGNOSIS — J3081 Allergic rhinitis due to animal (cat) (dog) hair and dander: Secondary | ICD-10-CM | POA: Diagnosis not present

## 2021-07-23 DIAGNOSIS — J3089 Other allergic rhinitis: Secondary | ICD-10-CM | POA: Diagnosis not present

## 2021-07-23 DIAGNOSIS — J301 Allergic rhinitis due to pollen: Secondary | ICD-10-CM | POA: Diagnosis not present

## 2021-07-31 DIAGNOSIS — J301 Allergic rhinitis due to pollen: Secondary | ICD-10-CM | POA: Diagnosis not present

## 2021-07-31 DIAGNOSIS — J3081 Allergic rhinitis due to animal (cat) (dog) hair and dander: Secondary | ICD-10-CM | POA: Diagnosis not present

## 2021-07-31 DIAGNOSIS — J3089 Other allergic rhinitis: Secondary | ICD-10-CM | POA: Diagnosis not present

## 2021-08-07 DIAGNOSIS — J3089 Other allergic rhinitis: Secondary | ICD-10-CM | POA: Diagnosis not present

## 2021-08-07 DIAGNOSIS — J3081 Allergic rhinitis due to animal (cat) (dog) hair and dander: Secondary | ICD-10-CM | POA: Diagnosis not present

## 2021-08-07 DIAGNOSIS — J301 Allergic rhinitis due to pollen: Secondary | ICD-10-CM | POA: Diagnosis not present

## 2021-08-22 ENCOUNTER — Other Ambulatory Visit (HOSPITAL_COMMUNITY): Payer: Self-pay

## 2021-08-26 DIAGNOSIS — J3089 Other allergic rhinitis: Secondary | ICD-10-CM | POA: Diagnosis not present

## 2021-08-26 DIAGNOSIS — J301 Allergic rhinitis due to pollen: Secondary | ICD-10-CM | POA: Diagnosis not present

## 2021-08-26 DIAGNOSIS — J3081 Allergic rhinitis due to animal (cat) (dog) hair and dander: Secondary | ICD-10-CM | POA: Diagnosis not present

## 2021-08-27 ENCOUNTER — Other Ambulatory Visit (HOSPITAL_COMMUNITY): Payer: Self-pay

## 2021-08-27 DIAGNOSIS — M25512 Pain in left shoulder: Secondary | ICD-10-CM | POA: Diagnosis not present

## 2021-08-29 ENCOUNTER — Other Ambulatory Visit: Payer: Self-pay | Admitting: Sports Medicine

## 2021-08-29 DIAGNOSIS — M25512 Pain in left shoulder: Secondary | ICD-10-CM

## 2021-09-03 DIAGNOSIS — J301 Allergic rhinitis due to pollen: Secondary | ICD-10-CM | POA: Diagnosis not present

## 2021-09-03 DIAGNOSIS — J3081 Allergic rhinitis due to animal (cat) (dog) hair and dander: Secondary | ICD-10-CM | POA: Diagnosis not present

## 2021-09-03 DIAGNOSIS — J3089 Other allergic rhinitis: Secondary | ICD-10-CM | POA: Diagnosis not present

## 2021-09-05 DIAGNOSIS — J3081 Allergic rhinitis due to animal (cat) (dog) hair and dander: Secondary | ICD-10-CM | POA: Diagnosis not present

## 2021-09-05 DIAGNOSIS — J301 Allergic rhinitis due to pollen: Secondary | ICD-10-CM | POA: Diagnosis not present

## 2021-09-05 DIAGNOSIS — J3089 Other allergic rhinitis: Secondary | ICD-10-CM | POA: Diagnosis not present

## 2021-09-07 ENCOUNTER — Ambulatory Visit
Admission: RE | Admit: 2021-09-07 | Discharge: 2021-09-07 | Disposition: A | Discharge status: Home / Self Care | Payer: 59 | Source: Ambulatory Visit | Attending: Sports Medicine | Admitting: Sports Medicine

## 2021-09-07 DIAGNOSIS — S46012A Strain of muscle(s) and tendon(s) of the rotator cuff of left shoulder, initial encounter: Secondary | ICD-10-CM | POA: Diagnosis not present

## 2021-09-07 DIAGNOSIS — M25512 Pain in left shoulder: Secondary | ICD-10-CM

## 2021-09-11 DIAGNOSIS — J301 Allergic rhinitis due to pollen: Secondary | ICD-10-CM | POA: Diagnosis not present

## 2021-09-11 DIAGNOSIS — J3081 Allergic rhinitis due to animal (cat) (dog) hair and dander: Secondary | ICD-10-CM | POA: Diagnosis not present

## 2021-09-11 DIAGNOSIS — J3089 Other allergic rhinitis: Secondary | ICD-10-CM | POA: Diagnosis not present

## 2021-09-12 DIAGNOSIS — M25512 Pain in left shoulder: Secondary | ICD-10-CM | POA: Diagnosis not present

## 2021-09-19 ENCOUNTER — Other Ambulatory Visit: Payer: Self-pay

## 2021-09-19 ENCOUNTER — Encounter (HOSPITAL_BASED_OUTPATIENT_CLINIC_OR_DEPARTMENT_OTHER): Payer: Self-pay | Admitting: Orthopaedic Surgery

## 2021-09-22 ENCOUNTER — Encounter (HOSPITAL_BASED_OUTPATIENT_CLINIC_OR_DEPARTMENT_OTHER)
Admission: RE | Admit: 2021-09-22 | Discharge: 2021-09-22 | Disposition: A | Discharge status: Home / Self Care | Payer: 59 | Source: Ambulatory Visit | Attending: Orthopaedic Surgery | Admitting: Orthopaedic Surgery

## 2021-09-22 DIAGNOSIS — S43432A Superior glenoid labrum lesion of left shoulder, initial encounter: Secondary | ICD-10-CM | POA: Diagnosis not present

## 2021-09-22 DIAGNOSIS — X58XXXA Exposure to other specified factors, initial encounter: Secondary | ICD-10-CM | POA: Diagnosis not present

## 2021-09-22 DIAGNOSIS — S46012A Strain of muscle(s) and tendon(s) of the rotator cuff of left shoulder, initial encounter: Secondary | ICD-10-CM | POA: Diagnosis present

## 2021-09-22 DIAGNOSIS — M25812 Other specified joint disorders, left shoulder: Secondary | ICD-10-CM | POA: Diagnosis not present

## 2021-09-22 DIAGNOSIS — M19012 Primary osteoarthritis, left shoulder: Secondary | ICD-10-CM | POA: Diagnosis not present

## 2021-09-22 DIAGNOSIS — M7522 Bicipital tendinitis, left shoulder: Secondary | ICD-10-CM | POA: Diagnosis not present

## 2021-09-22 DIAGNOSIS — E785 Hyperlipidemia, unspecified: Secondary | ICD-10-CM | POA: Diagnosis not present

## 2021-09-22 DIAGNOSIS — R519 Headache, unspecified: Secondary | ICD-10-CM | POA: Diagnosis not present

## 2021-09-22 DIAGNOSIS — J45909 Unspecified asthma, uncomplicated: Secondary | ICD-10-CM | POA: Diagnosis not present

## 2021-09-22 LAB — SURGICAL PCR SCREEN
MRSA, PCR: NEGATIVE
Staphylococcus aureus: POSITIVE — AB

## 2021-09-22 NOTE — H&P (Signed)
? ? ?PREOPERATIVE H&P ? ?Chief Complaint: left shoulder cartilage disorder, impingement, OA,bicep tendinitis,rotator cuff tear ? ?HPI: ?Susan Wagner is a 56 y.o. female who is scheduled for, Procedure(s): ?SHOULDER ARTHROSCOPY WITH SUBACROMIAL DECOMPRESSION, ROTATOR CUFF REPAIR AND BICEP TENDON REPAIR, DISTAL CLAVICLE EXCISION.  ? ?Patient has a past medical history significant for asthma and HLD.  ? 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  She has tried injections and therapy and has not made much progress.   ? ?Symptoms are rated as moderate to severe, and have been worsening.  This is significantly impairing activities of daily living.   ? ?Please see clinic note for further details on this patient's care.   ? ?She has elected for surgical management.  ? ?Past Medical History:  ?Diagnosis Date  ? Anemia   ? Asthma   ? Frequent UTI   ? Headache(784.0)   ? Hyperlipidemia, acquired   ? Migraine   ? ?Past Surgical History:  ?Procedure Laterality Date  ? TONSILLECTOMY    ? ?Social History  ? ?Socioeconomic History  ? Marital status: Married  ?  Spouse name: Not on file  ? Number of children: Not on file  ? Years of education: Not on file  ? Highest education level: Not on file  ?Occupational History  ? Not on file  ?Tobacco Use  ? Smoking status: Former  ?  Types: Cigarettes  ?  Quit date: 04/02/2008  ?  Years since quitting: 13.4  ? Smokeless tobacco: Never  ?Vaping Use  ? Vaping Use: Never used  ?Substance and Sexual Activity  ? Alcohol use: Not Currently  ?  Alcohol/week: 0.0 standard drinks  ? Drug use: No  ? Sexual activity: Yes  ?  Birth control/protection: None, Post-menopausal  ?  Comment: INTERCOURSE AGE 60, SEXUAL PARTNERS MORE THAN 5  ?Other Topics Concern  ? Not on file  ?Social History Narrative  ? Not on file  ? ?Social Determinants of Health  ? ?Financial Resource Strain: Not on file  ?Food Insecurity: Not on  file  ?Transportation Needs: Not on file  ?Physical Activity: Not on file  ?Stress: Not on file  ?Social Connections: Not on file  ? ?Family History  ?Problem Relation Age of Onset  ? Hypertension Father   ? Stroke Father   ? Cancer Maternal Grandmother   ?     uterine cancer  ? Hypertension Mother   ? Heart failure Mother 2  ?     CHF  ? Bipolar disorder Sister   ? Breast cancer Cousin   ? ?No Known Allergies ?Prior to Admission medications   ?Medication Sig Start Date End Date Taking? Authorizing Provider  ?Cholecalciferol (VITAMIN D) 2000 units CAPS Take by mouth.   Yes [provider]  ?levocetirizine (XYZAL) 5 MG tablet TAKE 1 TABLET BY MOUTH DAILY IN THE EVENING 04/08/20 09/19/21 Yes Mosetta Anis, MD  ?levocetirizine (XYZAL) 5 MG tablet Take 1 tablet by mouth daily in the evening 05/09/21  Yes   ?mometasone (NASONEX) 50 MCG/ACT nasal spray Place 1 to 2 sprays in each nostril once a day 06/25/21  Yes   ?Multiple Vitamins-Minerals (MULTIVITAMIN WITH MINERALS) tablet Take 1 tablet by mouth daily.   Yes [provider]  ?Omega-3 Fatty Acids (FISH OIL PO) Take 3 tablets by mouth daily.   Yes [provider]  ?rosuvastatin (CRESTOR) 10 MG tablet  Take 1 tablet (10 mg total) by mouth daily. 07/10/21  Yes   ?sertraline (ZOLOFT) 100 MG tablet TAKE 1 AND 1/2 TABLETS BY MOUTH ONCE DAILY 07/10/21  Yes   ?topiramate (TOPAMAX) 50 MG tablet TAKE 1 TABLET BY MOUTH ONCE A DAY 01/14/21  Yes   ?zolmitriptan (ZOMIG) 5 MG tablet Take 1 tablet by mouth twice a day 01/16/21  Yes   ?amoxicillin (AMOXIL) 500 MG capsule Take 2 capsules by mouth now then take 1 capsule 3 times daily until gone 10/16/20     ?chlorhexidine (PERIDEX) 0.12 % solution RINSE 1/2 OZ. TWICE DAILY AFTER BREAKFAST AND BEFORE BEDTIME 10/16/20     ?EPINEPHrine (EPIPEN 2-PAK) 0.3 mg/0.3 mL IJ SOAJ injection Use as directed as needed 06/13/21     ?fenofibrate (TRICOR) 48 MG tablet Take one tablet by mouth once a day. 01/20/21     ?IRON PO Take 1 tablet by  mouth daily.    [provider]  ?promethazine (PHENERGAN) 25 MG tablet Take 25 mg by mouth every 6 (six) hours as needed for nausea or vomiting.    [provider]  ?SUMAtriptan (IMITREX) 100 MG tablet Take 100 mg by mouth every 2 (two) hours as needed for migraine or headache. May repeat in 2 hours if headache persists or recurs.    [provider]  ?topiramate (TOPAMAX) 50 MG tablet TAKE 1 TABLET BY MOUTH ONCE A DAY 01/25/20 01/24/21  Hulan Fess, MD  ? ? ?ROS: All other systems have been reviewed and were otherwise negative with the exception of those mentioned in the HPI and as above. ? ?Physical Exam: ?General: Alert, no acute distress ?Cardiovascular: No pedal edema ?Respiratory: No cyanosis, no use of accessory musculature ?GI: No organomegaly, abdomen is soft and non-tender ?Skin: No lesions in the area of chief complaint ?Neurologic: Sensation intact distally ?Psychiatric: Patient is competent for consent with normal mood and affect ?Lymphatic: No axillary or cervical lymphadenopathy ? ?MUSCULOSKELETAL:  ?Left shoulder: Positive AC tenderness to palpation, impingement and O'Brien's test.  Active forward elevation to 100.  Passive to 150.  Cuff strength is 4/5 supraspinatus.  ? ?Imaging: ?MRI demonstrates to my eye full thickness leading edge supraspinatus tear, significant fluid around the biceps, type II acromion and AC arthrosis.   ? ?Assessment: ?left shoulder cartilage disorder, impingement, OA,bicep tendinitis,rotator cuff tear ? ?Plan: ?Plan for Procedure(s): ?SHOULDER ARTHROSCOPY WITH SUBACROMIAL DECOMPRESSION, ROTATOR CUFF REPAIR AND BICEP TENDON REPAIR, DISTAL CLAVICLE EXCISION ? ?The risks benefits and alternatives were discussed with the patient including but not limited to the risks of nonoperative treatment, versus surgical intervention including infection, bleeding, nerve injury,  blood clots, cardiopulmonary complications, morbidity, mortality, among others, and  they were willing to proceed.  ? ?The patient acknowledged the explanation, agreed to proceed with the plan and consent was signed.  ? ?Operative Plan: Left shoulder scope with biceps tenodesis, distal clavicle excision, subacromial decompression and rotator cuff repair  ?Discharge Medications: Standard ?DVT Prophylaxis: None ?Physical Therapy: Outpatient PT ?Special Discharge needs: Sling. IceMan ? ? ?Ethelda Chick, PA-C ? ?09/22/2021 ?5:06 PM ? ?

## 2021-09-22 NOTE — Progress Notes (Signed)
Surgical soap given with instructions, pt verbalized understanding. Enhanced Recovery after Surgery  ?Enhanced Recovery after Surgery is a protocol used to improve the stress on your body and your recovery after surgery. ? ?Patient Instructions ? ?The night before surgery:  ?No food after midnight. ONLY clear liquids after midnight ? ?The day of surgery (if you do NOT have diabetes):  ?Drink ONE (1) Pre-Surgery Clear Ensure as directed.   ?This drink was given to you during your hospital  ?pre-op appointment visit. ?The pre-op nurse will instruct you on the time to drink the  ?Pre-Surgery Ensure depending on your surgery time. ?Finish the drink at the designated time by the pre-op nurse.  ?Nothing else to drink after completing the  ?Pre-Surgery Clear Ensure. ? ?The day of surgery (if you have diabetes): ?Drink ONE (1) Gatorade 2 (G2) as directed. ?This drink was given to you during your hospital  ?pre-op appointment visit.  ?The pre-op nurse will instruct you on the time to drink the  ? Gatorade 2 (G2) depending on your surgery time. ?Color of the Gatorade may vary. Red is not allowed. ?Nothing else to drink after completing the  ?Gatorade 2 (G2). ? ?       If office.you have questions, please contact your surgeon?s office  ?Benzoyl peroxide gel given with written instructions, pt verbalized understanding.  ?

## 2021-09-24 DIAGNOSIS — J301 Allergic rhinitis due to pollen: Secondary | ICD-10-CM | POA: Diagnosis not present

## 2021-09-24 DIAGNOSIS — J3089 Other allergic rhinitis: Secondary | ICD-10-CM | POA: Diagnosis not present

## 2021-09-24 DIAGNOSIS — J3081 Allergic rhinitis due to animal (cat) (dog) hair and dander: Secondary | ICD-10-CM | POA: Diagnosis not present

## 2021-09-24 NOTE — Discharge Instructions (Addendum)
Ophelia Charter MD, MPH Noemi Chapel, PA-C [place] [address] [phone] (tel)   [phone] (fax)   POST-OPERATIVE INSTRUCTIONS - SHOULDER ARTHROSCOPY  WOUND CARE You may remove the Operative Dressing on Post-Op Day #3 (72hrs after surgery).   Alternatively if you would like you can leave dressing on until follow-up if within 7-8 days but keep it dry. Leave steri-strips in place until they fall off on their own, usually 2 weeks postop. There may be a small amount of fluid/bleeding leaking at the surgical site.  This is normal; the shoulder is filled with fluid during the procedure and can leak for 24-48hrs after surgery.  You may change/reinforce the bandage as needed.  Use the Cryocuff or Ice as often as possible for the first 7 days, then as needed for pain relief. Always keep a towel, ACE wrap or other barrier between the cooling unit and your skin.  You may shower on Post-Op Day #3. Gently pat the area dry. Do not soak the shoulder in water or submerge it. Keep incisions as dry as possible. Do not go swimming in the pool or ocean until 4 weeks after surgery or when otherwise instructed.    EXERCISES/BRACING Sling should be used at all times until follow-up. (including when you sleep!)   You may remove sling for hygiene Do not lift anything heavy with your operative arm! It is normal for your fingers/hand to become more swollen after surgery and discolored from bruising.                     - This will resolve over the first few weeks usually after surgery. Please continue to ambulate and do not stay sitting or lying for too long.    - Perform foot and wrist pumps to assist in circulation.   PHYSICAL THERAPY - You will begin physical therapy soon after surgery (unless otherwise specified) - You have a physical therapy appointment at [facility] (across the hall from our office) on Monday, May 22nd at 1 pm   POST-OP MEDICATIONS- Multimodal  approach to pain control In general your pain will be controlled with a combination of substances.  Prescriptions unless otherwise discussed are electronically sent to your pharmacy.  This is a carefully made plan we use to minimize narcotic use.     Celebrex - Anti-inflammatory medication taken on a scheduled basis Acetaminophen - Non-narcotic pain medicine taken on a scheduled basis  Oxycodone - This is a strong narcotic, to be used only on an "as needed" basis for SEVERE pain. Zofran - take as needed for nausea  FOLLOW-UP If you develop a Fever (?101.5), Redness or Drainage from the surgical incision site, please call our office to arrange for an evaluation. Please call the office to schedule a follow-up appointment, 7-10 days post-operatively.    HELPFUL INFORMATION  If you had a block, it will wear off between 8-24 hrs postop typically.  This is period when your pain may go from nearly zero to the pain you would have had postop without the block.  This is an abrupt transition but nothing dangerous is happening.   You may take an extra dose of narcotic when this happens.  You may be more comfortable sleeping in a semi-seated position the first few nights following surgery.  Keep a pillow propped under the elbow and forearm for comfort.  If you have a recliner type of chair it might be beneficial.  If not  that is fine too, but it would be helpful to sleep propped up with pillows behind your operated shoulder as well under your elbow and forearm.  This will reduce pulling on the suture lines.  When dressing, put your operative arm in the sleeve first.  When getting undressed, take your operative arm out last.  Loose fitting, button-down shirts are recommended.  Often in the first days after surgery you may be more comfortable keeping your operative arm under your shirt and not through the sleeve.  You may return to work/school in the next couple of days when you feel up to it.  Desk work and  typing in the sling is fine.  We suggest you use the pain medication the first night prior to going to bed, in order to ease any pain when the anesthesia wears off. You should avoid taking pain medications on an empty stomach as it will make you nauseous.  You should wean off your narcotic medicines as soon as you are able.  Most patients will be off or using minimal narcotics before their first postop appointment.   Do not drink alcoholic beverages or take illicit drugs when taking pain medications.  It is against the law to drive while taking narcotics.  In some states it is against the law to drive while your arm is in a sling.   Pain medication may make you constipated.  Below are a few solutions to try in this order: Decrease the amount of pain medication if you aren't having pain. Drink lots of decaffeinated fluids. Drink prune juice and/or eat dried prunes  If the first 3 don't work start with additional solutions Take Colace - an over-the-counter stool softener Take Senokot - an over-the-counter laxative Take Miralax - a stronger over-the-counter laxative  For more information including helpful videos and documents visit our website:   [email or web address]    Post Anesthesia Home Care Instructions  Activity: Get plenty of rest for the remainder of the day. A responsible individual must stay with you for 24 hours following the procedure.  For the next 24 hours, DO NOT: -Drive a car -Paediatric nurse -Drink alcoholic beverages -Take any medication unless instructed by your physician -Make any legal decisions or sign important papers.  Meals: Start with liquid foods such as gelatin or soup. Progress to regular foods as tolerated. Avoid greasy, spicy, heavy foods. If nausea and/or vomiting occur, drink only clear liquids until the nausea and/or vomiting subsides. Call your physician if vomiting continues.  Special  Instructions/Symptoms: Your throat may feel dry or sore from the anesthesia or the breathing tube placed in your throat during surgery. If this causes discomfort, gargle with warm salt water. The discomfort should disappear within 24 hours.  If you had a scopolamine patch placed behind your ear for the management of post- operative nausea and/or vomiting:  1. The medication in the patch is effective for 72 hours, after which it should be removed.  Wrap patch in a tissue and discard in the trash. Wash hands thoroughly with soap and water. 2. You may remove the patch earlier than 72 hours if you experience unpleasant side effects which may include dry mouth, dizziness or visual disturbances. 3. Avoid touching the patch. Wash your hands with soap and water after contact with the patch.      Regional Anesthesia Blocks  1. Numbness or the inability to move the "blocked" extremity may last from 3-48 hours after placement. The length of time depends on the  medication injected and your individual response to the medication. If the numbness is not going away after 48 hours, call your surgeon.  2. The extremity that is blocked will need to be protected until the numbness is gone and the  Strength has returned. Because you cannot feel it, you will need to take extra care to avoid injury. Because it may be weak, you may have difficulty moving it or using it. You may not know what position it is in without looking at it while the block is in effect.  3. For blocks in the legs and feet, returning to weight bearing and walking needs to be done carefully. You will need to wait until the numbness is entirely gone and the strength has returned. You should be able to move your leg and foot normally before you try and bear weight or walk. You will need someone to be with you when you first try to ensure you do not fall and possibly risk injury.  4. Bruising and tenderness at the needle site are common side effects and  will resolve in a few days.  5. Persistent numbness or new problems with movement should be communicated to the surgeon or the [place] [phone] [place] [phone]). Information for Discharge Teaching:   EXPAREL (bupivacaine liposome injectable suspension)   Your surgeon or anesthesiologist gave you EXPAREL(bupivacaine) to help control your pain after surgery.  EXPAREL is a local anesthetic that provides pain relief by numbing the tissue around the surgical site. EXPAREL is designed to release pain medication over time and can control pain for up to 72 hours. Depending on how you respond to EXPAREL, you may require less pain medication during your recovery.  Possible side effects: Temporary loss of sensation or ability to move in the area where bupivacaine was injected. Nausea, vomiting, constipation Rarely, numbness and tingling in your mouth or lips, lightheadedness, or anxiety may occur. Call your doctor right away if you think you may be experiencing any of these sensations, or if you have other questions regarding possible side effects.  Follow all other discharge instructions given to you by your surgeon or nurse. Eat a healthy diet and drink plenty of water or other fluids.  If you return to the hospital for any reason within 96 hours following the administration of EXPAREL, it is important for health care providers to know that you have received this anesthetic. A teal colored band has been placed on your arm with the date, time and amount of EXPAREL you have received in order to alert and inform your health care providers. Please leave this armband in place for the full 96 hours following administration, and then you may remove the band.

## 2021-09-25 ENCOUNTER — Ambulatory Visit (HOSPITAL_BASED_OUTPATIENT_CLINIC_OR_DEPARTMENT_OTHER)
Admission: RE | Admit: 2021-09-25 | Discharge: 2021-09-25 | Disposition: A | Discharge status: Home / Self Care | Payer: 59 | Attending: Orthopaedic Surgery | Admitting: Orthopaedic Surgery

## 2021-09-25 ENCOUNTER — Other Ambulatory Visit: Payer: Self-pay

## 2021-09-25 ENCOUNTER — Ambulatory Visit (HOSPITAL_BASED_OUTPATIENT_CLINIC_OR_DEPARTMENT_OTHER): Payer: 59 | Admitting: Anesthesiology

## 2021-09-25 ENCOUNTER — Encounter (HOSPITAL_BASED_OUTPATIENT_CLINIC_OR_DEPARTMENT_OTHER)
Admission: RE | Disposition: A | Discharge status: Home / Self Care | Payer: Self-pay | Source: Home / Self Care | Attending: Orthopaedic Surgery

## 2021-09-25 ENCOUNTER — Encounter (HOSPITAL_BASED_OUTPATIENT_CLINIC_OR_DEPARTMENT_OTHER): Payer: Self-pay | Admitting: Orthopaedic Surgery

## 2021-09-25 DIAGNOSIS — R519 Headache, unspecified: Secondary | ICD-10-CM | POA: Insufficient documentation

## 2021-09-25 DIAGNOSIS — M7522 Bicipital tendinitis, left shoulder: Secondary | ICD-10-CM

## 2021-09-25 DIAGNOSIS — M19012 Primary osteoarthritis, left shoulder: Secondary | ICD-10-CM | POA: Diagnosis not present

## 2021-09-25 DIAGNOSIS — S46012A Strain of muscle(s) and tendon(s) of the rotator cuff of left shoulder, initial encounter: Secondary | ICD-10-CM | POA: Insufficient documentation

## 2021-09-25 DIAGNOSIS — S43432A Superior glenoid labrum lesion of left shoulder, initial encounter: Secondary | ICD-10-CM

## 2021-09-25 DIAGNOSIS — Z01818 Encounter for other preprocedural examination: Secondary | ICD-10-CM

## 2021-09-25 DIAGNOSIS — M25812 Other specified joint disorders, left shoulder: Secondary | ICD-10-CM | POA: Diagnosis not present

## 2021-09-25 DIAGNOSIS — E785 Hyperlipidemia, unspecified: Secondary | ICD-10-CM | POA: Insufficient documentation

## 2021-09-25 DIAGNOSIS — M75122 Complete rotator cuff tear or rupture of left shoulder, not specified as traumatic: Secondary | ICD-10-CM | POA: Diagnosis not present

## 2021-09-25 DIAGNOSIS — G8918 Other acute postprocedural pain: Secondary | ICD-10-CM | POA: Diagnosis not present

## 2021-09-25 DIAGNOSIS — M7552 Bursitis of left shoulder: Secondary | ICD-10-CM | POA: Diagnosis not present

## 2021-09-25 DIAGNOSIS — M24112 Other articular cartilage disorders, left shoulder: Secondary | ICD-10-CM | POA: Diagnosis not present

## 2021-09-25 DIAGNOSIS — J45909 Unspecified asthma, uncomplicated: Secondary | ICD-10-CM | POA: Diagnosis not present

## 2021-09-25 DIAGNOSIS — I151 Hypertension secondary to other renal disorders: Secondary | ICD-10-CM

## 2021-09-25 DIAGNOSIS — M75102 Unspecified rotator cuff tear or rupture of left shoulder, not specified as traumatic: Secondary | ICD-10-CM

## 2021-09-25 DIAGNOSIS — X58XXXA Exposure to other specified factors, initial encounter: Secondary | ICD-10-CM | POA: Insufficient documentation

## 2021-09-25 HISTORY — PX: SHOULDER ARTHROSCOPY WITH SUBACROMIAL DECOMPRESSION, ROTATOR CUFF REPAIR AND BICEP TENDON REPAIR: SHX5687

## 2021-09-25 HISTORY — DX: Unspecified asthma, uncomplicated: J45.909

## 2021-09-25 SURGERY — SHOULDER ARTHROSCOPY WITH SUBACROMIAL DECOMPRESSION, ROTATOR CUFF REPAIR AND BICEP TENDON REPAIR
Anesthesia: General | Site: Shoulder | Laterality: Left

## 2021-09-25 MED ORDER — TRANEXAMIC ACID-NACL 1000-0.7 MG/100ML-% IV SOLN
1000.0000 mg | INTRAVENOUS | Status: AC
  Administered 2021-09-25: 1000 mg via INTRAVENOUS

## 2021-09-25 MED ORDER — ACETAMINOPHEN 500 MG PO TABS: 1000.0000 mg | ORAL_TABLET | Freq: Once | ORAL | Status: DC

## 2021-09-25 MED ORDER — FENTANYL CITRATE (PF) 100 MCG/2ML IJ SOLN: 25.0000 ug | INTRAMUSCULAR | Status: DC | PRN

## 2021-09-25 MED ORDER — ONDANSETRON HCL 4 MG/2ML IJ SOLN
INTRAMUSCULAR | Status: DC | PRN
  Administered 2021-09-25: 4 mg via INTRAVENOUS

## 2021-09-25 MED ORDER — GABAPENTIN 300 MG PO CAPS
ORAL_CAPSULE | ORAL | Status: AC
  Filled 2021-09-25: qty 1

## 2021-09-25 MED ORDER — ACETAMINOPHEN 500 MG PO TABS: 1000.0000 mg | ORAL_TABLET | Freq: Three times a day (TID) | ORAL | 0 refills | Status: AC

## 2021-09-25 MED ORDER — ONDANSETRON HCL 4 MG PO TABS: 4.0000 mg | ORAL_TABLET | Freq: Three times a day (TID) | ORAL | 0 refills | Status: AC | PRN

## 2021-09-25 MED ORDER — DEXAMETHASONE SODIUM PHOSPHATE 4 MG/ML IJ SOLN
INTRAMUSCULAR | Status: DC | PRN
  Administered 2021-09-25: 10 mg via INTRAVENOUS

## 2021-09-25 MED ORDER — VANCOMYCIN HCL 1000 MG IV SOLR
INTRAVENOUS | Status: DC | PRN
  Administered 2021-09-25: 1000 mg via INTRAVENOUS

## 2021-09-25 MED ORDER — CELECOXIB 100 MG PO CAPS: 100.0000 mg | ORAL_CAPSULE | Freq: Two times a day (BID) | ORAL | 0 refills | Status: AC

## 2021-09-25 MED ORDER — FENTANYL CITRATE (PF) 100 MCG/2ML IJ SOLN
INTRAMUSCULAR | Status: DC | PRN
Start: 2021-09-25 — End: 2021-09-25
  Administered 2021-09-25: 50 ug via INTRAVENOUS

## 2021-09-25 MED ORDER — CEFAZOLIN SODIUM-DEXTROSE 2-4 GM/100ML-% IV SOLN
INTRAVENOUS | Status: AC
  Filled 2021-09-25: qty 100

## 2021-09-25 MED ORDER — FENTANYL CITRATE (PF) 100 MCG/2ML IJ SOLN
50.0000 ug | Freq: Once | INTRAMUSCULAR | Status: AC
  Administered 2021-09-25: 50 ug via INTRAVENOUS

## 2021-09-25 MED ORDER — ROCURONIUM BROMIDE 100 MG/10ML IV SOLN
INTRAVENOUS | Status: DC | PRN
  Administered 2021-09-25: 50 mg via INTRAVENOUS

## 2021-09-25 MED ORDER — BUPIVACAINE LIPOSOME 1.3 % IJ SUSP
INTRAMUSCULAR | Status: DC | PRN
  Administered 2021-09-25: 10 mL via PERINEURAL

## 2021-09-25 MED ORDER — ROCURONIUM BROMIDE 10 MG/ML (PF) SYRINGE
PREFILLED_SYRINGE | INTRAVENOUS | Status: AC
Start: 2021-09-25 — End: ?
  Filled 2021-09-25: qty 10

## 2021-09-25 MED ORDER — TRANEXAMIC ACID-NACL 1000-0.7 MG/100ML-% IV SOLN
INTRAVENOUS | Status: AC
  Filled 2021-09-25: qty 100

## 2021-09-25 MED ORDER — FENTANYL CITRATE (PF) 100 MCG/2ML IJ SOLN
INTRAMUSCULAR | Status: AC
  Filled 2021-09-25: qty 2

## 2021-09-25 MED ORDER — OXYCODONE HCL 5 MG PO TABS: ORAL_TABLET | ORAL | 0 refills | Status: AC

## 2021-09-25 MED ORDER — PROPOFOL 10 MG/ML IV BOLUS
INTRAVENOUS | Status: AC
Start: 2021-09-25 — End: ?
  Filled 2021-09-25: qty 20

## 2021-09-25 MED ORDER — VANCOMYCIN HCL IN DEXTROSE 1-5 GM/200ML-% IV SOLN
1000.0000 mg | INTRAVENOUS | Status: AC
  Administered 2021-09-25: 1000 mg via INTRAVENOUS

## 2021-09-25 MED ORDER — PHENYLEPHRINE HCL (PRESSORS) 10 MG/ML IV SOLN
INTRAVENOUS | Status: DC | PRN
  Administered 2021-09-25 (×3): 80 ug via INTRAVENOUS
  Administered 2021-09-25: 240 ug via INTRAVENOUS
  Administered 2021-09-25: 160 ug via INTRAVENOUS

## 2021-09-25 MED ORDER — MIDAZOLAM HCL 2 MG/2ML IJ SOLN
INTRAMUSCULAR | Status: AC
  Filled 2021-09-25: qty 2

## 2021-09-25 MED ORDER — DEXAMETHASONE SODIUM PHOSPHATE 10 MG/ML IJ SOLN
INTRAMUSCULAR | Status: AC
  Filled 2021-09-25: qty 1

## 2021-09-25 MED ORDER — VANCOMYCIN HCL IN DEXTROSE 1-5 GM/200ML-% IV SOLN
INTRAVENOUS | Status: AC
  Filled 2021-09-25: qty 200

## 2021-09-25 MED ORDER — PROPOFOL 10 MG/ML IV BOLUS
INTRAVENOUS | Status: DC | PRN
  Administered 2021-09-25: 160 mg via INTRAVENOUS

## 2021-09-25 MED ORDER — ONDANSETRON HCL 4 MG/2ML IJ SOLN
INTRAMUSCULAR | Status: AC
  Filled 2021-09-25: qty 2

## 2021-09-25 MED ORDER — ACETAMINOPHEN 500 MG PO TABS
ORAL_TABLET | ORAL | Status: AC
  Filled 2021-09-25: qty 2

## 2021-09-25 MED ORDER — OXYCODONE HCL 5 MG/5ML PO SOLN: 5.0000 mg | Freq: Once | ORAL | Status: DC | PRN

## 2021-09-25 MED ORDER — LACTATED RINGERS IV SOLN: INTRAVENOUS | Status: DC

## 2021-09-25 MED ORDER — DROPERIDOL 2.5 MG/ML IJ SOLN: 0.6250 mg | Freq: Once | INTRAMUSCULAR | Status: DC | PRN

## 2021-09-25 MED ORDER — OXYCODONE HCL 5 MG PO TABS: 5.0000 mg | ORAL_TABLET | Freq: Once | ORAL | Status: DC | PRN

## 2021-09-25 MED ORDER — GABAPENTIN 300 MG PO CAPS
300.0000 mg | ORAL_CAPSULE | Freq: Once | ORAL | Status: AC
  Administered 2021-09-25: 300 mg via ORAL

## 2021-09-25 MED ORDER — LIDOCAINE HCL (CARDIAC) PF 100 MG/5ML IV SOSY
PREFILLED_SYRINGE | INTRAVENOUS | Status: DC | PRN
  Administered 2021-09-25: 100 mg via INTRAVENOUS

## 2021-09-25 MED ORDER — CEFAZOLIN SODIUM-DEXTROSE 2-4 GM/100ML-% IV SOLN
2.0000 g | INTRAVENOUS | Status: AC
  Administered 2021-09-25: 2 g via INTRAVENOUS

## 2021-09-25 MED ORDER — ACETAMINOPHEN 500 MG PO TABS
1000.0000 mg | ORAL_TABLET | Freq: Once | ORAL | Status: AC
Start: 2021-09-25 — End: 2021-09-25
  Administered 2021-09-25: 1000 mg via ORAL

## 2021-09-25 MED ORDER — EPINEPHRINE PF 1 MG/ML IJ SOLN
INTRAMUSCULAR | Status: DC | PRN
  Administered 2021-09-25: 6000 mL

## 2021-09-25 MED ORDER — BUPIVACAINE-EPINEPHRINE (PF) 0.5% -1:200000 IJ SOLN
INTRAMUSCULAR | Status: DC | PRN
  Administered 2021-09-25: 15 mL via PERINEURAL

## 2021-09-25 MED ORDER — MIDAZOLAM HCL 2 MG/2ML IJ SOLN
1.0000 mg | Freq: Once | INTRAMUSCULAR | Status: AC
Start: 2021-09-25 — End: 2021-09-25
  Administered 2021-09-25: 1 mg via INTRAVENOUS

## 2021-09-25 MED ORDER — SUGAMMADEX SODIUM 200 MG/2ML IV SOLN
INTRAVENOUS | Status: DC | PRN
  Administered 2021-09-25: 350 mg via INTRAVENOUS

## 2021-09-25 MED ORDER — LIDOCAINE 2% (20 MG/ML) 5 ML SYRINGE
INTRAMUSCULAR | Status: AC
  Filled 2021-09-25: qty 5

## 2021-09-25 SURGICAL SUPPLY — 60 items
AID PSTN UNV HD RSTRNT DISP (MISCELLANEOUS) ×1
ANCH SUT 2 FBRTK KNTLS 1.8 (Anchor) ×2 IMPLANT
ANCH SUT SWLK 19.1X4.75 (Anchor) ×1 IMPLANT
ANCHOR SUT 1.8 FIBERTAK SB KL (Anchor) ×2 IMPLANT
ANCHOR SUT BIO SW 4.75X19.1 (Anchor) ×1 IMPLANT
APL PRP STRL LF DISP 70% ISPRP (MISCELLANEOUS) ×1
BLADE EXCALIBUR 4.0X13 (MISCELLANEOUS) ×2 IMPLANT
BLADE SURG 10 STRL SS (BLADE) IMPLANT
BURR OVAL 8 FLU 4.0X13 (MISCELLANEOUS) IMPLANT
CANNULA 5.75X71 LONG (CANNULA) IMPLANT
CANNULA PASSPORT 5 (CANNULA) IMPLANT
CANNULA PASSPORT BUTTON 10-40 (CANNULA) IMPLANT
CANNULA TWIST IN 8.25X7CM (CANNULA) ×1 IMPLANT
CHLORAPREP W/TINT 26 (MISCELLANEOUS) ×2 IMPLANT
CLSR STERI-STRIP ANTIMIC 1/2X4 (GAUZE/BANDAGES/DRESSINGS) ×2 IMPLANT
COOLER ICEMAN CLASSIC (MISCELLANEOUS) ×2 IMPLANT
DRAPE IMP U-DRAPE 54X76 (DRAPES) ×2 IMPLANT
DRAPE INCISE IOBAN 66X45 STRL (DRAPES) IMPLANT
DRAPE SHOULDER BEACH CHAIR (DRAPES) ×2 IMPLANT
DRSG PAD ABDOMINAL 8X10 ST (GAUZE/BANDAGES/DRESSINGS) ×2 IMPLANT
DW OUTFLOW CASSETTE/TUBE SET (MISCELLANEOUS) ×2 IMPLANT
GAUZE SPONGE 4X4 12PLY STRL (GAUZE/BANDAGES/DRESSINGS) ×2 IMPLANT
GLOVE BIO SURGEON STRL SZ 6.5 (GLOVE) ×3 IMPLANT
GLOVE BIOGEL PI IND STRL 6.5 (GLOVE) ×1 IMPLANT
GLOVE BIOGEL PI IND STRL 8 (GLOVE) ×1 IMPLANT
GLOVE BIOGEL PI INDICATOR 6.5 (GLOVE) ×2
GLOVE BIOGEL PI INDICATOR 8 (GLOVE) ×1
GLOVE ECLIPSE 8.0 STRL XLNG CF (GLOVE) ×2 IMPLANT
GOWN STRL REUS W/ TWL LRG LVL3 (GOWN DISPOSABLE) ×2 IMPLANT
GOWN STRL REUS W/TWL LRG LVL3 (GOWN DISPOSABLE) ×4
GOWN STRL REUS W/TWL XL LVL3 (GOWN DISPOSABLE) ×2 IMPLANT
KIT SHOULDER STAB MARCO (KITS) ×2 IMPLANT
KIT STR SPEAR 1.8 FBRTK DISP (KITS) IMPLANT
LASSO CRESCENT QUICKPASS (SUTURE) IMPLANT
MANIFOLD NEPTUNE II (INSTRUMENTS) ×2 IMPLANT
NDL SAFETY ECLIPSE 18X1.5 (NEEDLE) ×1 IMPLANT
NDL SCORPION MULTI FIRE (NEEDLE) IMPLANT
NEEDLE HYPO 18GX1.5 SHARP (NEEDLE) ×2
NEEDLE SCORPION MULTI FIRE (NEEDLE) ×2 IMPLANT
PACK ARTHROSCOPY DSU (CUSTOM PROCEDURE TRAY) ×2 IMPLANT
PACK BASIN DAY SURGERY FS (CUSTOM PROCEDURE TRAY) ×2 IMPLANT
PAD COLD SHLDR WRAP-ON (PAD) ×2 IMPLANT
PORT APPOLLO RF 90DEGREE MULTI (SURGICAL WAND) ×2 IMPLANT
RESTRAINT HEAD UNIVERSAL NS (MISCELLANEOUS) ×2 IMPLANT
SHEET MEDIUM DRAPE 40X70 STRL (DRAPES) IMPLANT
SLEEVE SCD COMPRESS KNEE MED (STOCKING) ×2 IMPLANT
SLING ARM FOAM STRAP LRG (SOFTGOODS) IMPLANT
SUT FIBERWIRE #2 38 T-5 BLUE (SUTURE)
SUT MNCRL AB 4-0 PS2 18 (SUTURE) ×2 IMPLANT
SUT PDS AB 1 CT  36 (SUTURE)
SUT PDS AB 1 CT 36 (SUTURE) IMPLANT
SUT TIGER TAPE 7 IN WHITE (SUTURE) ×1 IMPLANT
SUTURE FIBERWR #2 38 T-5 BLUE (SUTURE) IMPLANT
SUTURE TAPE TIGERLINK 1.3MM BL (SUTURE) IMPLANT
SUTURETAPE TIGERLINK 1.3MM BL (SUTURE)
SYR 5ML LL (SYRINGE) ×2 IMPLANT
TAPE FIBER 2MM 7IN #2 BLUE (SUTURE) IMPLANT
TOWEL GREEN STERILE FF (TOWEL DISPOSABLE) ×4 IMPLANT
TUBE CONNECTING 20X1/4 (TUBING) ×2 IMPLANT
TUBING ARTHROSCOPY IRRIG 16FT (MISCELLANEOUS) ×2 IMPLANT

## 2021-09-25 NOTE — Anesthesia Preprocedure Evaluation (Addendum)
Anesthesia Evaluation  Patient identified by MRN, date of birth, ID band Patient awake    Reviewed: Allergy & Precautions, NPO status , Patient's Chart, lab work & pertinent test results  History of Anesthesia Complications Negative for: history of anesthetic complications  Airway Mallampati: III  TM Distance: >3 FB Neck ROM: Full    Dental no notable dental hx.    Pulmonary asthma , former smoker,    Pulmonary exam normal        Cardiovascular negative cardio ROS Normal cardiovascular exam     Neuro/Psych  Headaches, negative psych ROS   GI/Hepatic negative GI ROS, Neg liver ROS,   Endo/Other  negative endocrine ROS  Renal/GU negative Renal ROS  negative genitourinary   Musculoskeletal left shoulder cartilage disorder, impingement, OA,bicep tendinitis,rotator cuff tear   Abdominal   Peds  Hematology negative hematology ROS (+)   Anesthesia Other Findings Day of surgery medications reviewed with patient.  Reproductive/Obstetrics negative OB ROS                            Anesthesia Physical Anesthesia Plan  ASA: 2  Anesthesia Plan: General   Post-op Pain Management: Tylenol PO (pre-op)* and Regional block*   Induction: Intravenous  PONV Risk Score and Plan: 3 and Treatment may vary due to age or medical condition, Midazolam, Dexamethasone and Ondansetron  Airway Management Planned: Oral ETT  Additional Equipment: None  Intra-op Plan:   Post-operative Plan: Extubation in OR  Informed Consent: I have reviewed the patients History and Physical, chart, labs and discussed the procedure including the risks, benefits and alternatives for the proposed anesthesia with the patient or authorized representative who has indicated his/her understanding and acceptance.     Dental advisory given  Plan Discussed with: CRNA  Anesthesia Plan Comments:        Anesthesia Quick  Evaluation

## 2021-09-25 NOTE — Anesthesia Procedure Notes (Signed)
Procedure Name: Intubation Date/Time: 09/25/2021 11:00 AM Performed by: Verita Lamb, CRNA Pre-anesthesia Checklist: Patient identified, Emergency Drugs available, Suction available and Patient being monitored Patient Re-evaluated:Patient Re-evaluated prior to induction Oxygen Delivery Method: Circle system utilized Preoxygenation: Pre-oxygenation with 100% oxygen Induction Type: IV induction Ventilation: Mask ventilation without difficulty Laryngoscope Size: Glidescope, Mac and 3 Grade View: Grade I Tube type: Oral Tube size: 7.0 mm Number of attempts: 1 Airway Equipment and Method: Stylet and Oral airway Placement Confirmation: ETT inserted through vocal cords under direct vision, positive ETCO2, breath sounds checked- equal and bilateral and CO2 detector Secured at: 22 cm Tube secured with: Tape Dental Injury: Teeth and Oropharynx as per pre-operative assessment  Comments: Small amount of blood in hypopharynx noted after DL.  Grade I view with glidescope, attempted look with mac 4 but mount opening too small.  Easy mask, difficulty with DL with glidescope because of very small mouth opening, but was able to achieve a grade I view.  Teeth and lips as preop

## 2021-09-25 NOTE — Op Note (Signed)
Orthopaedic Surgery Operative Note (CSN: [id number])  Susan Wagner  1965-12-11 Date of Surgery: 09/25/2021   DIAGNOSES: Left shoulder, acute on chronic rotator cuff tear, SLAP tear, biceps tendinitis, AC arthritis, and subacromial impingement.  POST-OPERATIVE DIAGNOSIS: same  PROCEDURE: Arthroscopic extensive debridement - [place], Supraspinatus Tendon, Anterior Labrum, and Superior Labrum Arthroscopic distal clavicle excision - 89211 Arthroscopic subacromial decompression - 94174 Arthroscopic rotator cuff repair - 08144 Arthroscopic biceps tenodesis - 81856   OPERATIVE FINDING: Exam under anesthesia: Normal Articular space: Normal Chondral surfaces: Normal Biceps:  Type II SLAP tear with significant tendinosis of the biceps Subscapularis: Intact  Supraspinatus: Incomplete tear near complete tear involving the supraspinatus at the leading edge.  There were only a few fibers remaining on the articular side.  Bursal side was clearly torn.  Speed fix single anchor repair performed. Infraspinatus: Intact      Post-operative plan: The patient will be non-weightbearing in a sling .  The patient will be discharged home.  DVT prophylaxis not indicated in ambulatory upper extremity patient without known risk factors.   Pain control with PRN pain medication preferring oral medicines.  Follow up plan will be scheduled in approximately 7 days for incision check and XR.  Surgeons:Primary: Hiram Gash, MD Assistants:Caroline McBane PA-C Location: [place] OR ROOM 1 Anesthesia: General with Exparel interscalene block Antibiotics: Ancef 2 g Tourniquet time: None Estimated Blood Loss: Minimal Complications: None Specimens: None Implants: Implant Name Type Inv. Item Serial No. Manufacturer Lot No. LRB No. Used Action  ANCHOR SUT BIO SW 4.75X19.1 - [id number] Anchor ANCHOR SUT BIO SW 4.75X19.1  [employer] [id number] Left 1 Implanted  ANCHOR SUT 1.8 FIBERTAK SB KL - [id number] Anchor  ANCHOR SUT 1.8 FIBERTAK SB KL  [employer] [id number] Left 1 Implanted  ANCHOR SUT 1.8 FIBERTAK SB KL - [id number] Anchor ANCHOR SUT 1.8 [employer] [id number] Left 1 Implanted    Indications for Surgery:   Susan Wagner is a 56 y.o. female with continued shoulder pain refractory to nonoperative measures for extended period of time.    The risks and benefits were explained at length including but not limited to continued pain, cuff failure, biceps tenodesis failure, stiffness, need for further surgery and infection.   Procedure:   Patient was correctly identified in the preoperative holding area and operative site marked.  Patient brought to OR and positioned beachchair on an Hardin table ensuring that all bony prominences were padded and the head was in an appropriate location.  Anesthesia was induced and the operative shoulder was prepped and draped in the usual sterile fashion.  Timeout was called preincision.  A standard posterior viewing portal was made after localizing the portal with a spinal needle.  An anterior accessory portal was also made.  After clearing the articular space the camera was positioned in the subacromial space.  Findings above.    Extensive debridement was performed of the anterior interval tissue, labral fraying and the bursa.  Subacromial decompression: We made a lateral portal with spinal needle guidance. We then proceeded to debride bursal tissue extensively with a shaver and arthrocare device. At that point we continued to identify the borders of the acromion and identify the spur. We then carefully preserved the deltoid fascia and used a burr to convert the acromion to a Type 1 flat acromion without issue.  Biceps tenodesis: We marked the tendon and then performed a tenotomy and debridement of the stump in the articular space. We  then identified the biceps tendon in its groove suprapec with the arthroscope in the lateral portal taking care to move from  lateral to medial to avoid injury to the subscapularis. At that point we unroofed the tendon itself and mobilized it. An accessory anterior portal was made in line with the tendon and we grasped it from the anterior superior portal and worked from the accessory anterior portal. Two Fibertak 1.45m knotless anchors were placed in the groove and the tendon was secured in a luggage loop style fashion with a pass of the limb of suture through the tendon using a scorpion device to avoid pull-through.  Repair was completed with good tension on the tendon.  Residual stump of the tendon was removed after being resected with a RF ablator.  Distal Clavicle resection:  The scope was placed in the subacromial space from the posterior portal.  A hemostat was placed through the anterior portal and we spread at the [facility] Association[facility].  A burr was then inserted and 10 mm of distal clavicle was resected taking care to avoid damage to the capsule around the joint and avoiding overhanging bone posteriorly.    We identified the tear.  It was so high-grade in regards to the bursal side that we completed it.  It was relatively small and only about 10 mm wide.  We felt that a medial anchor would be difficult to place and we may cause more damage.  We felt that a speed fix type single anchor repair was appropriate.  We prepared the tuberosity for healing.  We then shuttled a fiber tape in mattress fashion using a scorpion.  We brought it over to a 4.75 bio composite swivel lock 8 to 10 mm below the tuberosity and obtain good purchase.  We had a robust repair.  The incisions were closed with absorbable monocryl and steri strips.  A sterile dressing was placed along with a sling. The patient was awoken from general anesthesia and taken to the PACU in stable condition without complication.   CNoemi Chapel PA-C, present and scrubbed throughout the case, critical for completion in a timely fashion, and for retraction, instrumentation,  closure.

## 2021-09-25 NOTE — Anesthesia Procedure Notes (Addendum)
Anesthesia Regional Block: Interscalene brachial plexus block   Pre-Anesthetic Checklist: , timeout performed,  Correct Patient, Correct Site, Correct Laterality,  Correct Procedure, Correct Position, site marked,  Risks and benefits discussed,  Pre-op evaluation,  At surgeon's request and post-op pain management  Laterality: Left  Prep: Maximum Sterile Barrier Precautions used, chloraprep       Needles:  Injection technique: Single-shot  Needle Type: Echogenic Stimulator Needle     Needle Length: 4cm  Needle Gauge: 22     Additional Needles:   Procedures:,,,, ultrasound used (permanent image in chart),,    Narrative:  Start time: 09/25/2021 10:12 AM End time: 09/25/2021 10:15 AM Injection made incrementally with aspirations every 5 mL.  Performed by: Personally  Anesthesiologist: Brennan Bailey, MD  Additional Notes: Risks, benefits, and alternative discussed. Patient gave consent for procedure. Patient prepped and draped in sterile fashion. Sedation administered, patient remains easily responsive to voice. Relevant anatomy identified with ultrasound guidance. Local anesthetic given in 5cc increments with no signs or symptoms of intravascular injection. No pain or paraesthesias with injection. Patient monitored throughout procedure with signs of LAST or immediate complications. Tolerated well. Ultrasound image placed in chart.  Tawny Asal, MD

## 2021-09-25 NOTE — Transfer of Care (Signed)
Immediate Anesthesia Transfer of Care Note  Patient: Susan Wagner  Procedure(s) Performed: SHOULDER ARTHROSCOPY WITH SUBACROMIAL DECOMPRESSION, ROTATOR CUFF REPAIR AND BICEP TENDON REPAIR, DISTAL CLAVICLE EXCISION (Left)  Patient Location: PACU  Anesthesia Type:General and Regional  Level of Consciousness: awake, alert  and oriented  Airway & Oxygen Therapy: Patient Spontanous Breathing and Patient connected to face mask oxygen  Post-op Assessment: Report given to RN and Post -op Vital signs reviewed and stable  Post vital signs: Reviewed and stable  Last Vitals:  Vitals Value Taken Time  BP 79/55 09/25/21 1200  Temp    Pulse 70 09/25/21 1201  Resp 11 09/25/21 1201  SpO2 96 % 09/25/21 1201  Vitals shown include unvalidated device data.  Last Pain:  Vitals:   09/25/21 0914  TempSrc: Oral  PainSc: 0-No pain         Complications: No notable events documented.

## 2021-09-25 NOTE — Progress Notes (Signed)
Assisted Dr. Howze with left, interscalene , ultrasound guided block. Side rails up, monitors on throughout procedure. See vital signs in flow sheet. Tolerated Procedure well. 

## 2021-09-25 NOTE — Interval H&P Note (Signed)
All questions answered, patient wants to proceed with procedure. ? ?

## 2021-09-26 ENCOUNTER — Encounter (HOSPITAL_BASED_OUTPATIENT_CLINIC_OR_DEPARTMENT_OTHER): Payer: Self-pay | Admitting: Orthopaedic Surgery

## 2021-09-26 NOTE — Anesthesia Postprocedure Evaluation (Signed)
Anesthesia Post Note  Patient: Susan Wagner  Procedure(s) Performed: SHOULDER ARTHROSCOPY WITH SUBACROMIAL DECOMPRESSION, ROTATOR CUFF REPAIR AND BICEP TENDON REPAIR, DISTAL CLAVICLE EXCISION (Left: Shoulder)     Patient location during evaluation: PACU Anesthesia Type: General and Regional Level of consciousness: awake and alert Pain management: pain level controlled Vital Signs Assessment: post-procedure vital signs reviewed and stable Respiratory status: spontaneous breathing, nonlabored ventilation, respiratory function stable and patient connected to nasal cannula oxygen Cardiovascular status: blood pressure returned to baseline and stable Postop Assessment: no apparent nausea or vomiting Anesthetic complications: no   No notable events documented.  Last Vitals:  Vitals:   09/25/21 1245 09/25/21 1321  BP: 109/72 123/86  Pulse: 90 92  Resp: 11 18  Temp:  36.9 C  SpO2: 96% 95%    Last Pain:  Vitals:   09/25/21 1321  TempSrc:   PainSc: 0-No pain   Pain Goal:                   Chaniece Barbato S

## 2021-09-30 DIAGNOSIS — M75122 Complete rotator cuff tear or rupture of left shoulder, not specified as traumatic: Secondary | ICD-10-CM | POA: Diagnosis not present

## 2021-09-30 DIAGNOSIS — M25612 Stiffness of left shoulder, not elsewhere classified: Secondary | ICD-10-CM | POA: Diagnosis not present

## 2021-09-30 DIAGNOSIS — M6281 Muscle weakness (generalized): Secondary | ICD-10-CM | POA: Diagnosis not present

## 2021-10-07 DIAGNOSIS — J3089 Other allergic rhinitis: Secondary | ICD-10-CM | POA: Diagnosis not present

## 2021-10-07 DIAGNOSIS — J3081 Allergic rhinitis due to animal (cat) (dog) hair and dander: Secondary | ICD-10-CM | POA: Diagnosis not present

## 2021-10-07 DIAGNOSIS — J301 Allergic rhinitis due to pollen: Secondary | ICD-10-CM | POA: Diagnosis not present

## 2021-10-07 DIAGNOSIS — M75122 Complete rotator cuff tear or rupture of left shoulder, not specified as traumatic: Secondary | ICD-10-CM | POA: Diagnosis not present

## 2021-10-08 DIAGNOSIS — M6281 Muscle weakness (generalized): Secondary | ICD-10-CM | POA: Diagnosis not present

## 2021-10-08 DIAGNOSIS — M25612 Stiffness of left shoulder, not elsewhere classified: Secondary | ICD-10-CM | POA: Diagnosis not present

## 2021-10-08 DIAGNOSIS — M75122 Complete rotator cuff tear or rupture of left shoulder, not specified as traumatic: Secondary | ICD-10-CM | POA: Diagnosis not present

## 2021-10-15 DIAGNOSIS — M75122 Complete rotator cuff tear or rupture of left shoulder, not specified as traumatic: Secondary | ICD-10-CM | POA: Diagnosis not present

## 2021-10-15 DIAGNOSIS — M25612 Stiffness of left shoulder, not elsewhere classified: Secondary | ICD-10-CM | POA: Diagnosis not present

## 2021-10-15 DIAGNOSIS — M6281 Muscle weakness (generalized): Secondary | ICD-10-CM | POA: Diagnosis not present

## 2021-10-17 DIAGNOSIS — J3089 Other allergic rhinitis: Secondary | ICD-10-CM | POA: Diagnosis not present

## 2021-10-17 DIAGNOSIS — J3081 Allergic rhinitis due to animal (cat) (dog) hair and dander: Secondary | ICD-10-CM | POA: Diagnosis not present

## 2021-10-17 DIAGNOSIS — J301 Allergic rhinitis due to pollen: Secondary | ICD-10-CM | POA: Diagnosis not present

## 2021-10-19 ENCOUNTER — Other Ambulatory Visit (HOSPITAL_COMMUNITY): Payer: Self-pay

## 2021-10-20 ENCOUNTER — Other Ambulatory Visit (HOSPITAL_COMMUNITY): Payer: Self-pay

## 2021-10-20 DIAGNOSIS — J301 Allergic rhinitis due to pollen: Secondary | ICD-10-CM | POA: Diagnosis not present

## 2021-10-20 DIAGNOSIS — J3081 Allergic rhinitis due to animal (cat) (dog) hair and dander: Secondary | ICD-10-CM | POA: Diagnosis not present

## 2021-10-20 DIAGNOSIS — J3089 Other allergic rhinitis: Secondary | ICD-10-CM | POA: Diagnosis not present

## 2021-10-20 MED ORDER — LEVOCETIRIZINE DIHYDROCHLORIDE 5 MG PO TABS
5.0000 mg | ORAL_TABLET | Freq: Every evening | ORAL | 0 refills | Status: DC
  Filled 2021-10-20: qty 90, 90d supply, fill #0

## 2021-10-22 DIAGNOSIS — M6281 Muscle weakness (generalized): Secondary | ICD-10-CM | POA: Diagnosis not present

## 2021-10-22 DIAGNOSIS — M75122 Complete rotator cuff tear or rupture of left shoulder, not specified as traumatic: Secondary | ICD-10-CM | POA: Diagnosis not present

## 2021-10-22 DIAGNOSIS — M25612 Stiffness of left shoulder, not elsewhere classified: Secondary | ICD-10-CM | POA: Diagnosis not present

## 2021-10-31 ENCOUNTER — Other Ambulatory Visit (HOSPITAL_COMMUNITY): Payer: Self-pay

## 2021-10-31 DIAGNOSIS — B349 Viral infection, unspecified: Secondary | ICD-10-CM | POA: Diagnosis not present

## 2021-10-31 DIAGNOSIS — J029 Acute pharyngitis, unspecified: Secondary | ICD-10-CM | POA: Diagnosis not present

## 2021-10-31 MED ORDER — LIDOCAINE VISCOUS HCL 2 % MT SOLN: OROMUCOSAL | 0 refills | Status: DC

## 2021-11-05 DIAGNOSIS — M75122 Complete rotator cuff tear or rupture of left shoulder, not specified as traumatic: Secondary | ICD-10-CM | POA: Diagnosis not present

## 2021-11-05 DIAGNOSIS — M6281 Muscle weakness (generalized): Secondary | ICD-10-CM | POA: Diagnosis not present

## 2021-11-05 DIAGNOSIS — M25612 Stiffness of left shoulder, not elsewhere classified: Secondary | ICD-10-CM | POA: Diagnosis not present

## 2021-11-07 ENCOUNTER — Telehealth: Payer: 59 | Admitting: Family Medicine

## 2021-11-07 DIAGNOSIS — H7320 Unspecified myringitis, unspecified ear: Secondary | ICD-10-CM

## 2021-11-07 MED ORDER — PREDNISONE 20 MG PO TABS
40.0000 mg | ORAL_TABLET | Freq: Every day | ORAL | 0 refills | Status: AC
Start: 2021-11-07 — End: 2021-11-10

## 2021-11-07 NOTE — Progress Notes (Signed)
E-Visit for Sinus Problems  We are sorry that you are not feeling well.  Here is how we plan to help!  Based on what you have shared with me it looks like you have sinusitis.  Sinusitis is inflammation and infection in the sinus cavities of the head.  Based on your presentation I believe you most likely have Acute Viral Sinusitis.This is an infection most likely caused by a virus. There is not specific treatment for viral sinusitis other than to help you with the symptoms until the infection runs its course.  You may use an oral decongestant such as Mucinex D or if you have glaucoma or high blood pressure use plain Mucinex. Saline nasal spray help and can safely be used as often as needed for congestion, I have prescribed: a short burst of prednisone, please continue to use the nasal spray daily as well.   Sinus infections are not as easily transmitted as other respiratory infection, however we still recommend that you avoid close contact with loved ones, especially the very young and elderly.  Remember to wash your hands thoroughly throughout the day as this is the number one way to prevent the spread of infection!  Home Care: Only take medications as instructed by your medical team. Do not take these medications with alcohol. A steam or ultrasonic humidifier can help congestion.  You can place a towel over your head and breathe in the steam from hot water coming from a faucet. Avoid close contacts especially the very young and the elderly. Cover your mouth when you cough or sneeze. Always remember to wash your hands.  Get Help Right Away If: You develop worsening fever or sinus pain. You develop a severe head ache or visual changes. Your symptoms persist after you have completed your treatment plan.  Make sure you Understand these instructions. Will watch your condition. Will get help right away if you are not doing well or get worse.   Thank you for choosing an e-visit.  Your e-visit  answers were reviewed by a board certified advanced clinical practitioner to complete your personal care plan. Depending upon the condition, your plan could have included both over the counter or prescription medications.  Please review your pharmacy choice. Make sure the pharmacy is open so you can pick up prescription now. If there is a problem, you may contact your provider through [employer] and have the prescription routed to another pharmacy.  Your safety is important to [place]. If you have drug allergies check your prescription carefully.   For the next 24 hours you can use MyChart to ask questions about today's visit, request a non-urgent call back, or ask for a work or school excuse. You will get an email in the next two days asking about your experience. I hope that your e-visit has been valuable and will speed your recovery.  I provided 5 minutes of non face-to-face time during this encounter for chart review, medication and order placement, as well as and documentation.

## 2021-11-10 ENCOUNTER — Ambulatory Visit: Payer: 59 | Admitting: Nurse Practitioner

## 2021-11-13 DIAGNOSIS — R509 Fever, unspecified: Secondary | ICD-10-CM | POA: Diagnosis not present

## 2021-11-13 DIAGNOSIS — R051 Acute cough: Secondary | ICD-10-CM | POA: Diagnosis not present

## 2021-11-13 DIAGNOSIS — J069 Acute upper respiratory infection, unspecified: Secondary | ICD-10-CM | POA: Diagnosis not present

## 2021-11-13 DIAGNOSIS — R52 Pain, unspecified: Secondary | ICD-10-CM | POA: Diagnosis not present

## 2021-11-13 DIAGNOSIS — U071 COVID-19: Secondary | ICD-10-CM | POA: Diagnosis not present

## 2021-11-18 DIAGNOSIS — M75122 Complete rotator cuff tear or rupture of left shoulder, not specified as traumatic: Secondary | ICD-10-CM | POA: Diagnosis not present

## 2021-11-20 ENCOUNTER — Other Ambulatory Visit (HOSPITAL_COMMUNITY): Payer: Self-pay

## 2021-11-20 DIAGNOSIS — M25612 Stiffness of left shoulder, not elsewhere classified: Secondary | ICD-10-CM | POA: Diagnosis not present

## 2021-11-20 DIAGNOSIS — M75122 Complete rotator cuff tear or rupture of left shoulder, not specified as traumatic: Secondary | ICD-10-CM | POA: Diagnosis not present

## 2021-11-20 DIAGNOSIS — M6281 Muscle weakness (generalized): Secondary | ICD-10-CM | POA: Diagnosis not present

## 2021-11-21 ENCOUNTER — Ambulatory Visit: Payer: 59 | Admitting: Nurse Practitioner

## 2021-11-21 ENCOUNTER — Other Ambulatory Visit (HOSPITAL_COMMUNITY): Payer: Self-pay

## 2021-11-24 ENCOUNTER — Ambulatory Visit (INDEPENDENT_AMBULATORY_CARE_PROVIDER_SITE_OTHER): Payer: 59 | Admitting: Nurse Practitioner

## 2021-11-24 ENCOUNTER — Other Ambulatory Visit (HOSPITAL_COMMUNITY)
Admission: RE | Admit: 2021-11-24 | Discharge: 2021-11-24 | Disposition: A | Discharge status: Home / Self Care | Payer: 59 | Source: Ambulatory Visit | Attending: Nurse Practitioner | Admitting: Nurse Practitioner

## 2021-11-24 ENCOUNTER — Encounter: Payer: Self-pay | Admitting: Nurse Practitioner

## 2021-11-24 VITALS — BP 116/68 | HR 111 | Resp 20 | Ht 64.37 in | Wt 180.4 lb

## 2021-11-24 DIAGNOSIS — M75122 Complete rotator cuff tear or rupture of left shoulder, not specified as traumatic: Secondary | ICD-10-CM | POA: Diagnosis not present

## 2021-11-24 DIAGNOSIS — Z8262 Family history of osteoporosis: Secondary | ICD-10-CM

## 2021-11-24 DIAGNOSIS — M25612 Stiffness of left shoulder, not elsewhere classified: Secondary | ICD-10-CM | POA: Diagnosis not present

## 2021-11-24 DIAGNOSIS — Z01419 Encounter for gynecological examination (general) (routine) without abnormal findings: Secondary | ICD-10-CM | POA: Diagnosis not present

## 2021-11-24 DIAGNOSIS — R000 Tachycardia, unspecified: Secondary | ICD-10-CM | POA: Diagnosis not present

## 2021-11-24 DIAGNOSIS — M6281 Muscle weakness (generalized): Secondary | ICD-10-CM | POA: Diagnosis not present

## 2021-11-24 DIAGNOSIS — Z780 Asymptomatic menopausal state: Secondary | ICD-10-CM | POA: Diagnosis not present

## 2021-11-24 NOTE — Progress Notes (Signed)
   Susan Wagner 11/10/1965 [id number]   History:  56 y.o. G0 presents for annual exam without GYN complaints. Postmenopausal - no HRT, no bleeding. Normal pap and mammogram history. HTN, HLD, migraines, and anxiety/depression managed by PCP. + Covid 11/24/21, doing much better. Started Sudafed to help with congestion.   Gynecologic History Contraception: post menopausal status Sexually active: Yes  Health maintenance Last Pap: 02/16/2017. Results were: Normal, 5-year repeat Last mammogram: 03/24/2021. Results were: Normal  Last colonoscopy: 10/2019. Results: Normal, 5-year recall Last Dexa: > 10 years ago per patient  Past medical history, past surgical history, family history and social history were all reviewed and documented in the EPIC chart. 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   ROS:  A ROS was performed and pertinent positives and negatives are included.  Exam:  Vitals:   11/24/21 1322  BP: 116/68  Pulse: (!) 111  Resp: 20  SpO2: 99%  Weight: 180 lb 6.4 oz (81.8 kg)  Height: 5' 4.37" (1.635 m)     Body mass index is 30.61 kg/m.  General appearance:  Normal Thyroid:  Symmetrical, normal in size, without palpable masses or nodularity. Respiratory  Auscultation:  Clear without wheezing or rhonchi Cardiovascular  Auscultation:  Tachycardia, regular, without rubs, murmurs or gallops  Edema/varicosities:  Not grossly evident Abdominal  Soft,nontender, without masses, guarding or rebound.  Liver/spleen:  No organomegaly noted  Hernia:  None appreciated  Skin  Inspection:  Grossly normal   Breasts: Examined lying and sitting.   Right: Without masses, retractions, discharge or axillary adenopathy.   Left: Without masses, retractions, discharge or axillary adenopathy. Genitourinary   Inguinal/mons:  Normal without inguinal adenopathy  External genitalia:  Normal appearing vulva with no masses, tenderness, or lesions  BUS/Urethra/Skene's glands:  Normal  Vagina:  Normal  appearing with normal color and discharge, no lesions. Atrophic changes  Cervix:  Normal appearing without discharge or lesions  Uterus:  Normal in size, shape and contour.  Midline and mobile, nontender  Adnexa/parametria:     Rt: Normal in size, without masses or tenderness.   Lt: Normal in size, without masses or tenderness.  Anus and perineum: Normal  Digital rectal exam: Normal sphincter tone without palpated masses or tenderness  Patient informed chaperone available to be present for breast and pelvic exam. Patient has requested no chaperone to be present. Patient has been advised what will be completed during breast and pelvic exam.   Assessment/Plan:  56 y.o. G0 for annual exam.    Well female exam with routine gynecological exam - Plan: Cytology - PAP( [facility] [facility]). Education provided on SBEs, importance of preventative screenings, current guidelines, high calcium diet, regular exercise, and multivitamin daily. Labs with PCP.   Postmenopausal - Plan: DG Bone Density. No HRT, no bleeding.   Tachycardia - HR 111. Reports it was in the 140s when she had covid. Recommend stopping Sudafed and continuing to monitor BP and HR.   Family history of osteoporosis in mother - Plan: DG Bone Density  Screening for cervical cancer - Normal Pap history. Pap today.   Screening for breast cancer - Normal mammogram history.  Continue annual screenings.  Normal breast exam today.  Screening for colon cancer - 10/2019 colonoscopy. Will repeat at GI's recommended interval.   Follow up in 1 year for annual.    [place], 1:46 PM 11/24/2021

## 2021-11-26 LAB — CYTOLOGY - PAP
Adequacy: ABSENT
Comment: NEGATIVE
Diagnosis: NEGATIVE
High risk HPV: NEGATIVE

## 2021-11-27 DIAGNOSIS — M25612 Stiffness of left shoulder, not elsewhere classified: Secondary | ICD-10-CM | POA: Diagnosis not present

## 2021-11-27 DIAGNOSIS — M6281 Muscle weakness (generalized): Secondary | ICD-10-CM | POA: Diagnosis not present

## 2021-11-27 DIAGNOSIS — M75122 Complete rotator cuff tear or rupture of left shoulder, not specified as traumatic: Secondary | ICD-10-CM | POA: Diagnosis not present

## 2021-11-28 ENCOUNTER — Other Ambulatory Visit: Payer: Self-pay | Admitting: Family Medicine

## 2021-11-28 DIAGNOSIS — Z1231 Encounter for screening mammogram for malignant neoplasm of breast: Secondary | ICD-10-CM

## 2021-12-01 DIAGNOSIS — M75122 Complete rotator cuff tear or rupture of left shoulder, not specified as traumatic: Secondary | ICD-10-CM | POA: Diagnosis not present

## 2021-12-01 DIAGNOSIS — M25612 Stiffness of left shoulder, not elsewhere classified: Secondary | ICD-10-CM | POA: Diagnosis not present

## 2021-12-01 DIAGNOSIS — M6281 Muscle weakness (generalized): Secondary | ICD-10-CM | POA: Diagnosis not present

## 2021-12-08 DIAGNOSIS — M6281 Muscle weakness (generalized): Secondary | ICD-10-CM | POA: Diagnosis not present

## 2021-12-08 DIAGNOSIS — M25612 Stiffness of left shoulder, not elsewhere classified: Secondary | ICD-10-CM | POA: Diagnosis not present

## 2021-12-08 DIAGNOSIS — M75122 Complete rotator cuff tear or rupture of left shoulder, not specified as traumatic: Secondary | ICD-10-CM | POA: Diagnosis not present

## 2021-12-15 DIAGNOSIS — M6281 Muscle weakness (generalized): Secondary | ICD-10-CM | POA: Diagnosis not present

## 2021-12-15 DIAGNOSIS — M25612 Stiffness of left shoulder, not elsewhere classified: Secondary | ICD-10-CM | POA: Diagnosis not present

## 2021-12-15 DIAGNOSIS — M75122 Complete rotator cuff tear or rupture of left shoulder, not specified as traumatic: Secondary | ICD-10-CM | POA: Diagnosis not present

## 2021-12-16 ENCOUNTER — Other Ambulatory Visit: Payer: Self-pay | Admitting: Nurse Practitioner

## 2021-12-16 ENCOUNTER — Ambulatory Visit (INDEPENDENT_AMBULATORY_CARE_PROVIDER_SITE_OTHER): Payer: 59

## 2021-12-16 DIAGNOSIS — M75122 Complete rotator cuff tear or rupture of left shoulder, not specified as traumatic: Secondary | ICD-10-CM | POA: Diagnosis not present

## 2021-12-16 DIAGNOSIS — Z13820 Encounter for screening for osteoporosis: Secondary | ICD-10-CM

## 2021-12-16 DIAGNOSIS — Z8262 Family history of osteoporosis: Secondary | ICD-10-CM

## 2021-12-16 DIAGNOSIS — Z780 Asymptomatic menopausal state: Secondary | ICD-10-CM | POA: Diagnosis not present

## 2021-12-22 DIAGNOSIS — M6281 Muscle weakness (generalized): Secondary | ICD-10-CM | POA: Diagnosis not present

## 2021-12-22 DIAGNOSIS — M25612 Stiffness of left shoulder, not elsewhere classified: Secondary | ICD-10-CM | POA: Diagnosis not present

## 2021-12-22 DIAGNOSIS — M75122 Complete rotator cuff tear or rupture of left shoulder, not specified as traumatic: Secondary | ICD-10-CM | POA: Diagnosis not present

## 2021-12-26 DIAGNOSIS — J301 Allergic rhinitis due to pollen: Secondary | ICD-10-CM | POA: Diagnosis not present

## 2021-12-26 DIAGNOSIS — J3089 Other allergic rhinitis: Secondary | ICD-10-CM | POA: Diagnosis not present

## 2021-12-26 DIAGNOSIS — J3081 Allergic rhinitis due to animal (cat) (dog) hair and dander: Secondary | ICD-10-CM | POA: Diagnosis not present

## 2021-12-29 DIAGNOSIS — M6281 Muscle weakness (generalized): Secondary | ICD-10-CM | POA: Diagnosis not present

## 2021-12-29 DIAGNOSIS — M75122 Complete rotator cuff tear or rupture of left shoulder, not specified as traumatic: Secondary | ICD-10-CM | POA: Diagnosis not present

## 2021-12-29 DIAGNOSIS — M25612 Stiffness of left shoulder, not elsewhere classified: Secondary | ICD-10-CM | POA: Diagnosis not present

## 2021-12-31 DIAGNOSIS — J3081 Allergic rhinitis due to animal (cat) (dog) hair and dander: Secondary | ICD-10-CM | POA: Diagnosis not present

## 2021-12-31 DIAGNOSIS — J301 Allergic rhinitis due to pollen: Secondary | ICD-10-CM | POA: Diagnosis not present

## 2021-12-31 DIAGNOSIS — J3089 Other allergic rhinitis: Secondary | ICD-10-CM | POA: Diagnosis not present

## 2021-12-31 DIAGNOSIS — J4520 Mild intermittent asthma, uncomplicated: Secondary | ICD-10-CM | POA: Diagnosis not present

## 2022-01-05 DIAGNOSIS — M75122 Complete rotator cuff tear or rupture of left shoulder, not specified as traumatic: Secondary | ICD-10-CM | POA: Diagnosis not present

## 2022-01-05 DIAGNOSIS — M25612 Stiffness of left shoulder, not elsewhere classified: Secondary | ICD-10-CM | POA: Diagnosis not present

## 2022-01-05 DIAGNOSIS — M6281 Muscle weakness (generalized): Secondary | ICD-10-CM | POA: Diagnosis not present

## 2022-01-06 ENCOUNTER — Encounter: Payer: Self-pay | Admitting: *Deleted

## 2022-01-13 DIAGNOSIS — M75122 Complete rotator cuff tear or rupture of left shoulder, not specified as traumatic: Secondary | ICD-10-CM | POA: Diagnosis not present

## 2022-01-13 DIAGNOSIS — M25612 Stiffness of left shoulder, not elsewhere classified: Secondary | ICD-10-CM | POA: Diagnosis not present

## 2022-01-13 DIAGNOSIS — M6281 Muscle weakness (generalized): Secondary | ICD-10-CM | POA: Diagnosis not present

## 2022-01-20 ENCOUNTER — Other Ambulatory Visit (HOSPITAL_COMMUNITY): Payer: Self-pay

## 2022-01-20 NOTE — Telephone Encounter (Signed)
My chart message came back unread. Patient informed, patient said she will review copy of results in my chart message.

## 2022-01-21 ENCOUNTER — Other Ambulatory Visit (HOSPITAL_COMMUNITY): Payer: Self-pay

## 2022-01-21 DIAGNOSIS — D649 Anemia, unspecified: Secondary | ICD-10-CM | POA: Diagnosis not present

## 2022-01-21 DIAGNOSIS — Z0000 Encounter for general adult medical examination without abnormal findings: Secondary | ICD-10-CM | POA: Diagnosis not present

## 2022-01-21 DIAGNOSIS — Z23 Encounter for immunization: Secondary | ICD-10-CM | POA: Diagnosis not present

## 2022-01-21 DIAGNOSIS — G43109 Migraine with aura, not intractable, without status migrainosus: Secondary | ICD-10-CM | POA: Diagnosis not present

## 2022-01-21 DIAGNOSIS — F329 Major depressive disorder, single episode, unspecified: Secondary | ICD-10-CM | POA: Diagnosis not present

## 2022-01-21 DIAGNOSIS — E782 Mixed hyperlipidemia: Secondary | ICD-10-CM | POA: Diagnosis not present

## 2022-01-21 MED ORDER — SERTRALINE HCL 100 MG PO TABS
150.0000 mg | ORAL_TABLET | Freq: Every day | ORAL | 3 refills | Status: DC
  Filled 2022-01-21 – 2022-05-11 (×2): qty 90, 60d supply, fill #0
  Filled 2022-08-10: qty 90, 60d supply, fill #1
  Filled 2022-10-18: qty 90, 60d supply, fill #2
  Filled 2022-12-27: qty 90, 60d supply, fill #3

## 2022-01-21 MED ORDER — ROSUVASTATIN CALCIUM 10 MG PO TABS
10.0000 mg | ORAL_TABLET | Freq: Every day | ORAL | 1 refills | Status: DC
  Filled 2022-05-11: qty 90, 90d supply, fill #0
  Filled 2022-10-18: qty 90, 90d supply, fill #1

## 2022-01-21 MED ORDER — ZOLMITRIPTAN 5 MG PO TABS
5.0000 mg | ORAL_TABLET | Freq: Two times a day (BID) | ORAL | 2 refills | Status: DC
  Filled 2022-01-21: qty 12, 30d supply, fill #0

## 2022-01-21 MED ORDER — TOPIRAMATE 50 MG PO TABS
50.0000 mg | ORAL_TABLET | Freq: Every day | ORAL | 3 refills | Status: DC
  Filled 2022-01-21 – 2022-02-22 (×2): qty 90, 90d supply, fill #0
  Filled 2022-07-02 – 2022-07-03 (×2): qty 90, 90d supply, fill #1
  Filled 2022-10-18: qty 90, 90d supply, fill #2

## 2022-01-22 ENCOUNTER — Other Ambulatory Visit (HOSPITAL_COMMUNITY): Payer: Self-pay

## 2022-01-30 DIAGNOSIS — J3089 Other allergic rhinitis: Secondary | ICD-10-CM | POA: Diagnosis not present

## 2022-01-30 DIAGNOSIS — J301 Allergic rhinitis due to pollen: Secondary | ICD-10-CM | POA: Diagnosis not present

## 2022-01-30 DIAGNOSIS — J3081 Allergic rhinitis due to animal (cat) (dog) hair and dander: Secondary | ICD-10-CM | POA: Diagnosis not present

## 2022-01-31 ENCOUNTER — Other Ambulatory Visit (HOSPITAL_COMMUNITY): Payer: Self-pay

## 2022-02-03 DIAGNOSIS — J3081 Allergic rhinitis due to animal (cat) (dog) hair and dander: Secondary | ICD-10-CM | POA: Diagnosis not present

## 2022-02-03 DIAGNOSIS — J3089 Other allergic rhinitis: Secondary | ICD-10-CM | POA: Diagnosis not present

## 2022-02-03 DIAGNOSIS — J301 Allergic rhinitis due to pollen: Secondary | ICD-10-CM | POA: Diagnosis not present

## 2022-02-13 ENCOUNTER — Other Ambulatory Visit (HOSPITAL_COMMUNITY): Payer: Self-pay

## 2022-02-18 DIAGNOSIS — J301 Allergic rhinitis due to pollen: Secondary | ICD-10-CM | POA: Diagnosis not present

## 2022-02-18 DIAGNOSIS — J3089 Other allergic rhinitis: Secondary | ICD-10-CM | POA: Diagnosis not present

## 2022-02-18 DIAGNOSIS — J3081 Allergic rhinitis due to animal (cat) (dog) hair and dander: Secondary | ICD-10-CM | POA: Diagnosis not present

## 2022-02-19 ENCOUNTER — Other Ambulatory Visit: Payer: Self-pay | Admitting: Family Medicine

## 2022-02-19 DIAGNOSIS — Z1231 Encounter for screening mammogram for malignant neoplasm of breast: Secondary | ICD-10-CM

## 2022-02-23 ENCOUNTER — Other Ambulatory Visit (HOSPITAL_COMMUNITY): Payer: Self-pay

## 2022-02-27 ENCOUNTER — Other Ambulatory Visit (HOSPITAL_COMMUNITY): Payer: Self-pay

## 2022-03-09 DIAGNOSIS — J301 Allergic rhinitis due to pollen: Secondary | ICD-10-CM | POA: Diagnosis not present

## 2022-03-09 DIAGNOSIS — J3081 Allergic rhinitis due to animal (cat) (dog) hair and dander: Secondary | ICD-10-CM | POA: Diagnosis not present

## 2022-03-09 DIAGNOSIS — J3089 Other allergic rhinitis: Secondary | ICD-10-CM | POA: Diagnosis not present

## 2022-03-20 DIAGNOSIS — J301 Allergic rhinitis due to pollen: Secondary | ICD-10-CM | POA: Diagnosis not present

## 2022-03-20 DIAGNOSIS — J3081 Allergic rhinitis due to animal (cat) (dog) hair and dander: Secondary | ICD-10-CM | POA: Diagnosis not present

## 2022-03-20 DIAGNOSIS — J3089 Other allergic rhinitis: Secondary | ICD-10-CM | POA: Diagnosis not present

## 2022-03-25 ENCOUNTER — Other Ambulatory Visit (HOSPITAL_BASED_OUTPATIENT_CLINIC_OR_DEPARTMENT_OTHER): Payer: Self-pay | Admitting: Family Medicine

## 2022-03-25 DIAGNOSIS — Z1231 Encounter for screening mammogram for malignant neoplasm of breast: Secondary | ICD-10-CM

## 2022-03-26 DIAGNOSIS — Z1231 Encounter for screening mammogram for malignant neoplasm of breast: Secondary | ICD-10-CM

## 2022-03-27 DIAGNOSIS — J3089 Other allergic rhinitis: Secondary | ICD-10-CM | POA: Diagnosis not present

## 2022-03-27 DIAGNOSIS — J3081 Allergic rhinitis due to animal (cat) (dog) hair and dander: Secondary | ICD-10-CM | POA: Diagnosis not present

## 2022-03-27 DIAGNOSIS — J301 Allergic rhinitis due to pollen: Secondary | ICD-10-CM | POA: Diagnosis not present

## 2022-03-31 ENCOUNTER — Inpatient Hospital Stay (HOSPITAL_BASED_OUTPATIENT_CLINIC_OR_DEPARTMENT_OTHER): Admission: RE | Admit: 2022-03-31 | Payer: 59 | Source: Ambulatory Visit | Admitting: Radiology

## 2022-03-31 DIAGNOSIS — Z1231 Encounter for screening mammogram for malignant neoplasm of breast: Secondary | ICD-10-CM

## 2022-04-06 ENCOUNTER — Other Ambulatory Visit (HOSPITAL_BASED_OUTPATIENT_CLINIC_OR_DEPARTMENT_OTHER): Payer: Self-pay | Admitting: Family Medicine

## 2022-04-06 DIAGNOSIS — Z1231 Encounter for screening mammogram for malignant neoplasm of breast: Secondary | ICD-10-CM

## 2022-04-08 ENCOUNTER — Ambulatory Visit (HOSPITAL_BASED_OUTPATIENT_CLINIC_OR_DEPARTMENT_OTHER)
Admission: RE | Admit: 2022-04-08 | Discharge: 2022-04-08 | Disposition: A | Discharge status: Home / Self Care | Payer: 59 | Source: Ambulatory Visit | Attending: Family Medicine | Admitting: Family Medicine

## 2022-04-08 DIAGNOSIS — J301 Allergic rhinitis due to pollen: Secondary | ICD-10-CM | POA: Diagnosis not present

## 2022-04-08 DIAGNOSIS — J3081 Allergic rhinitis due to animal (cat) (dog) hair and dander: Secondary | ICD-10-CM | POA: Diagnosis not present

## 2022-04-08 DIAGNOSIS — J3089 Other allergic rhinitis: Secondary | ICD-10-CM | POA: Diagnosis not present

## 2022-04-08 DIAGNOSIS — Z1231 Encounter for screening mammogram for malignant neoplasm of breast: Secondary | ICD-10-CM | POA: Diagnosis not present

## 2022-04-14 DIAGNOSIS — J301 Allergic rhinitis due to pollen: Secondary | ICD-10-CM | POA: Diagnosis not present

## 2022-04-14 DIAGNOSIS — J3081 Allergic rhinitis due to animal (cat) (dog) hair and dander: Secondary | ICD-10-CM | POA: Diagnosis not present

## 2022-04-14 DIAGNOSIS — J3089 Other allergic rhinitis: Secondary | ICD-10-CM | POA: Diagnosis not present

## 2022-04-22 ENCOUNTER — Other Ambulatory Visit (HOSPITAL_COMMUNITY): Payer: Self-pay

## 2022-04-29 DIAGNOSIS — J3081 Allergic rhinitis due to animal (cat) (dog) hair and dander: Secondary | ICD-10-CM | POA: Diagnosis not present

## 2022-04-29 DIAGNOSIS — J3089 Other allergic rhinitis: Secondary | ICD-10-CM | POA: Diagnosis not present

## 2022-04-29 DIAGNOSIS — J301 Allergic rhinitis due to pollen: Secondary | ICD-10-CM | POA: Diagnosis not present

## 2022-05-07 DIAGNOSIS — J3081 Allergic rhinitis due to animal (cat) (dog) hair and dander: Secondary | ICD-10-CM | POA: Diagnosis not present

## 2022-05-07 DIAGNOSIS — J301 Allergic rhinitis due to pollen: Secondary | ICD-10-CM | POA: Diagnosis not present

## 2022-05-07 DIAGNOSIS — J3089 Other allergic rhinitis: Secondary | ICD-10-CM | POA: Diagnosis not present

## 2022-05-11 ENCOUNTER — Other Ambulatory Visit (HOSPITAL_COMMUNITY): Payer: Self-pay

## 2022-05-11 MED ORDER — LEVOCETIRIZINE DIHYDROCHLORIDE 5 MG PO TABS
5.0000 mg | ORAL_TABLET | Freq: Every evening | ORAL | 3 refills | Status: DC
  Filled 2022-05-11: qty 30, 30d supply, fill #0
  Filled 2022-07-02: qty 30, 30d supply, fill #1
  Filled 2022-07-03: qty 90, 90d supply, fill #1

## 2022-05-12 ENCOUNTER — Other Ambulatory Visit (HOSPITAL_COMMUNITY): Payer: Self-pay

## 2022-05-12 MED ORDER — EPINEPHRINE 0.3 MG/0.3ML IJ SOAJ
INTRAMUSCULAR | 1 refills | Status: DC
  Filled 2022-05-12: qty 2, 30d supply, fill #0

## 2022-05-14 ENCOUNTER — Other Ambulatory Visit (HOSPITAL_COMMUNITY): Payer: Self-pay

## 2022-05-14 MED ORDER — ZOLMITRIPTAN 5 MG PO TABS
5.0000 mg | ORAL_TABLET | Freq: Two times a day (BID) | ORAL | 2 refills | Status: DC
  Filled 2022-05-14 – 2022-05-27 (×2): qty 12, 30d supply, fill #0

## 2022-05-16 ENCOUNTER — Other Ambulatory Visit (HOSPITAL_COMMUNITY): Payer: Self-pay

## 2022-05-18 ENCOUNTER — Other Ambulatory Visit (HOSPITAL_COMMUNITY): Payer: Self-pay

## 2022-05-20 ENCOUNTER — Other Ambulatory Visit (HOSPITAL_COMMUNITY): Payer: Self-pay

## 2022-05-25 ENCOUNTER — Other Ambulatory Visit (HOSPITAL_COMMUNITY): Payer: Self-pay

## 2022-05-27 ENCOUNTER — Other Ambulatory Visit (HOSPITAL_COMMUNITY): Payer: Self-pay

## 2022-05-27 DIAGNOSIS — J3081 Allergic rhinitis due to animal (cat) (dog) hair and dander: Secondary | ICD-10-CM | POA: Diagnosis not present

## 2022-05-27 DIAGNOSIS — L280 Lichen simplex chronicus: Secondary | ICD-10-CM | POA: Diagnosis not present

## 2022-05-27 DIAGNOSIS — D485 Neoplasm of uncertain behavior of skin: Secondary | ICD-10-CM | POA: Diagnosis not present

## 2022-05-27 DIAGNOSIS — J3089 Other allergic rhinitis: Secondary | ICD-10-CM | POA: Diagnosis not present

## 2022-05-27 DIAGNOSIS — J301 Allergic rhinitis due to pollen: Secondary | ICD-10-CM | POA: Diagnosis not present

## 2022-05-29 DIAGNOSIS — J3089 Other allergic rhinitis: Secondary | ICD-10-CM | POA: Diagnosis not present

## 2022-05-29 DIAGNOSIS — J3081 Allergic rhinitis due to animal (cat) (dog) hair and dander: Secondary | ICD-10-CM | POA: Diagnosis not present

## 2022-05-29 DIAGNOSIS — J301 Allergic rhinitis due to pollen: Secondary | ICD-10-CM | POA: Diagnosis not present

## 2022-06-02 ENCOUNTER — Other Ambulatory Visit (HOSPITAL_COMMUNITY): Payer: Self-pay

## 2022-06-02 MED ORDER — ZOLMITRIPTAN 5 MG PO TABS
5.0000 mg | ORAL_TABLET | ORAL | 2 refills | Status: DC | PRN
  Filled 2022-06-02: qty 12, 30d supply, fill #0

## 2022-06-12 DIAGNOSIS — J3089 Other allergic rhinitis: Secondary | ICD-10-CM | POA: Diagnosis not present

## 2022-06-12 DIAGNOSIS — J3081 Allergic rhinitis due to animal (cat) (dog) hair and dander: Secondary | ICD-10-CM | POA: Diagnosis not present

## 2022-06-12 DIAGNOSIS — J301 Allergic rhinitis due to pollen: Secondary | ICD-10-CM | POA: Diagnosis not present

## 2022-06-15 DIAGNOSIS — J301 Allergic rhinitis due to pollen: Secondary | ICD-10-CM | POA: Diagnosis not present

## 2022-06-16 DIAGNOSIS — J3089 Other allergic rhinitis: Secondary | ICD-10-CM | POA: Diagnosis not present

## 2022-06-16 DIAGNOSIS — J3081 Allergic rhinitis due to animal (cat) (dog) hair and dander: Secondary | ICD-10-CM | POA: Diagnosis not present

## 2022-06-17 DIAGNOSIS — J3081 Allergic rhinitis due to animal (cat) (dog) hair and dander: Secondary | ICD-10-CM | POA: Diagnosis not present

## 2022-06-17 DIAGNOSIS — J3089 Other allergic rhinitis: Secondary | ICD-10-CM | POA: Diagnosis not present

## 2022-06-17 DIAGNOSIS — J301 Allergic rhinitis due to pollen: Secondary | ICD-10-CM | POA: Diagnosis not present

## 2022-06-26 DIAGNOSIS — J301 Allergic rhinitis due to pollen: Secondary | ICD-10-CM | POA: Diagnosis not present

## 2022-06-26 DIAGNOSIS — J3089 Other allergic rhinitis: Secondary | ICD-10-CM | POA: Diagnosis not present

## 2022-06-26 DIAGNOSIS — J3081 Allergic rhinitis due to animal (cat) (dog) hair and dander: Secondary | ICD-10-CM | POA: Diagnosis not present

## 2022-07-03 ENCOUNTER — Other Ambulatory Visit: Payer: Self-pay

## 2022-07-03 ENCOUNTER — Other Ambulatory Visit (HOSPITAL_COMMUNITY): Payer: Self-pay

## 2022-07-03 DIAGNOSIS — J4520 Mild intermittent asthma, uncomplicated: Secondary | ICD-10-CM | POA: Diagnosis not present

## 2022-07-03 DIAGNOSIS — J3081 Allergic rhinitis due to animal (cat) (dog) hair and dander: Secondary | ICD-10-CM | POA: Diagnosis not present

## 2022-07-03 DIAGNOSIS — J3089 Other allergic rhinitis: Secondary | ICD-10-CM | POA: Diagnosis not present

## 2022-07-03 DIAGNOSIS — J301 Allergic rhinitis due to pollen: Secondary | ICD-10-CM | POA: Diagnosis not present

## 2022-07-03 MED ORDER — LEVOCETIRIZINE DIHYDROCHLORIDE 5 MG PO TABS
5.0000 mg | ORAL_TABLET | Freq: Every evening | ORAL | 2 refills | Status: DC
  Filled 2022-07-03 – 2022-10-18 (×2): qty 90, 90d supply, fill #0
  Filled 2023-06-02: qty 90, 90d supply, fill #1

## 2022-07-15 DIAGNOSIS — J301 Allergic rhinitis due to pollen: Secondary | ICD-10-CM | POA: Diagnosis not present

## 2022-07-15 DIAGNOSIS — J3089 Other allergic rhinitis: Secondary | ICD-10-CM | POA: Diagnosis not present

## 2022-07-15 DIAGNOSIS — J3081 Allergic rhinitis due to animal (cat) (dog) hair and dander: Secondary | ICD-10-CM | POA: Diagnosis not present

## 2022-08-10 ENCOUNTER — Other Ambulatory Visit (HOSPITAL_COMMUNITY): Payer: Self-pay

## 2022-08-24 DIAGNOSIS — J3089 Other allergic rhinitis: Secondary | ICD-10-CM | POA: Diagnosis not present

## 2022-08-24 DIAGNOSIS — J301 Allergic rhinitis due to pollen: Secondary | ICD-10-CM | POA: Diagnosis not present

## 2022-08-24 DIAGNOSIS — J3081 Allergic rhinitis due to animal (cat) (dog) hair and dander: Secondary | ICD-10-CM | POA: Diagnosis not present

## 2022-09-04 DIAGNOSIS — J301 Allergic rhinitis due to pollen: Secondary | ICD-10-CM | POA: Diagnosis not present

## 2022-09-04 DIAGNOSIS — J3089 Other allergic rhinitis: Secondary | ICD-10-CM | POA: Diagnosis not present

## 2022-09-04 DIAGNOSIS — J3081 Allergic rhinitis due to animal (cat) (dog) hair and dander: Secondary | ICD-10-CM | POA: Diagnosis not present

## 2022-10-09 DIAGNOSIS — J3089 Other allergic rhinitis: Secondary | ICD-10-CM | POA: Diagnosis not present

## 2022-10-09 DIAGNOSIS — J301 Allergic rhinitis due to pollen: Secondary | ICD-10-CM | POA: Diagnosis not present

## 2022-10-09 DIAGNOSIS — J3081 Allergic rhinitis due to animal (cat) (dog) hair and dander: Secondary | ICD-10-CM | POA: Diagnosis not present

## 2022-10-19 ENCOUNTER — Other Ambulatory Visit: Payer: Self-pay

## 2022-10-19 ENCOUNTER — Other Ambulatory Visit (HOSPITAL_COMMUNITY): Payer: Self-pay

## 2022-10-23 DIAGNOSIS — J301 Allergic rhinitis due to pollen: Secondary | ICD-10-CM | POA: Diagnosis not present

## 2022-10-23 DIAGNOSIS — J3081 Allergic rhinitis due to animal (cat) (dog) hair and dander: Secondary | ICD-10-CM | POA: Diagnosis not present

## 2022-10-23 DIAGNOSIS — J3089 Other allergic rhinitis: Secondary | ICD-10-CM | POA: Diagnosis not present

## 2022-11-10 DIAGNOSIS — J301 Allergic rhinitis due to pollen: Secondary | ICD-10-CM | POA: Diagnosis not present

## 2022-11-10 DIAGNOSIS — J3089 Other allergic rhinitis: Secondary | ICD-10-CM | POA: Diagnosis not present

## 2022-11-10 DIAGNOSIS — J3081 Allergic rhinitis due to animal (cat) (dog) hair and dander: Secondary | ICD-10-CM | POA: Diagnosis not present

## 2022-11-13 IMAGING — MR MR SHOULDER*L* W/O CM
5 series · 39 of 40 positions shown · non-contrast
Comparison: X-ray shoulder 08/27/2021.

CLINICAL DATA: Acute left shoulder pain radiating down arm.

EXAM:
MRI OF THE LEFT SHOULDER WITHOUT CONTRAST
TECHNIQUE: Multiplanar, multisequence MR imaging of the shoulder was performed.
No intravenous contrast was administered.

[Series 3: T2 fat-sat · axial · 4.0mm · 0.27mm/px · z∈[-98,+43]mm · 9 of 30 slices shown (1 of 3)]
[im 1/30]
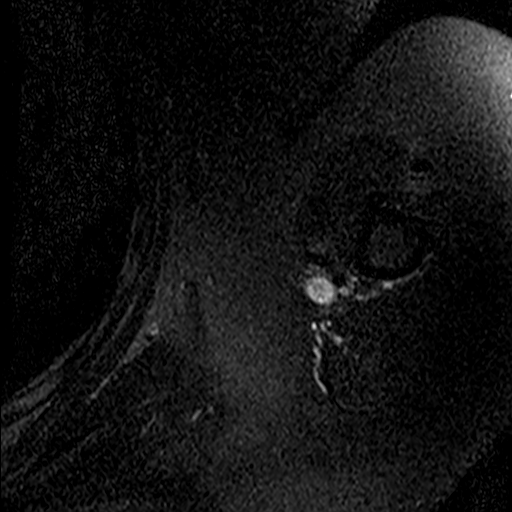
[im 4/30]
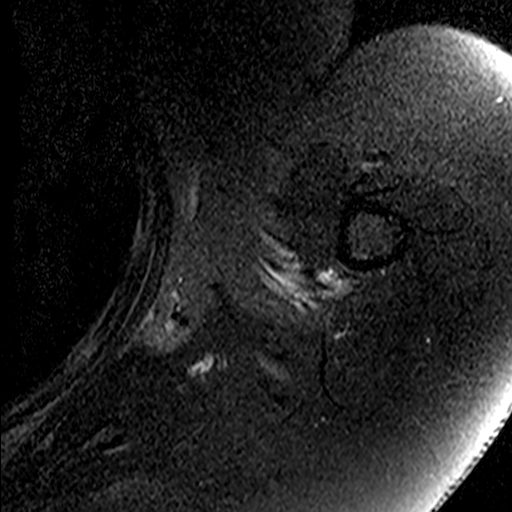
[im 7/30]
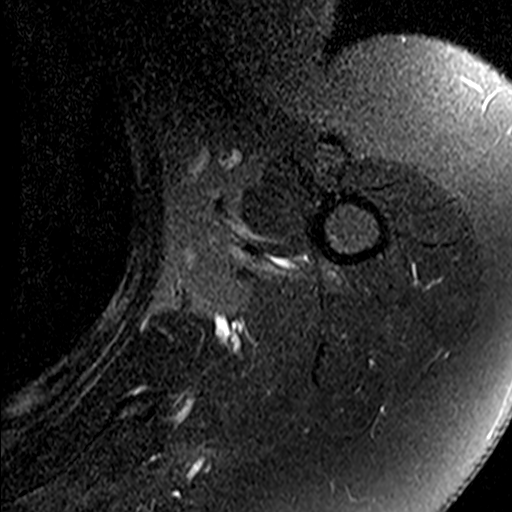
[im 10/30]
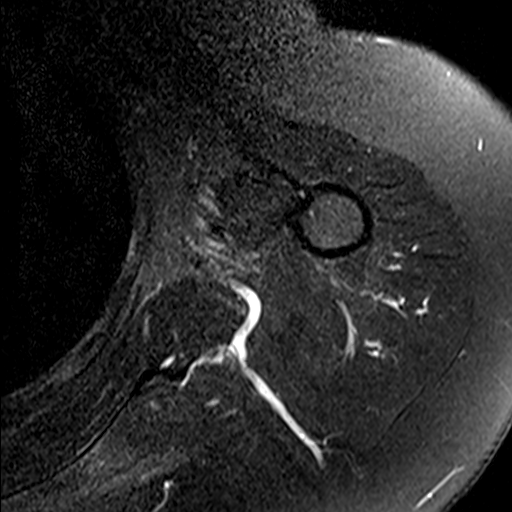
[im 13/30]
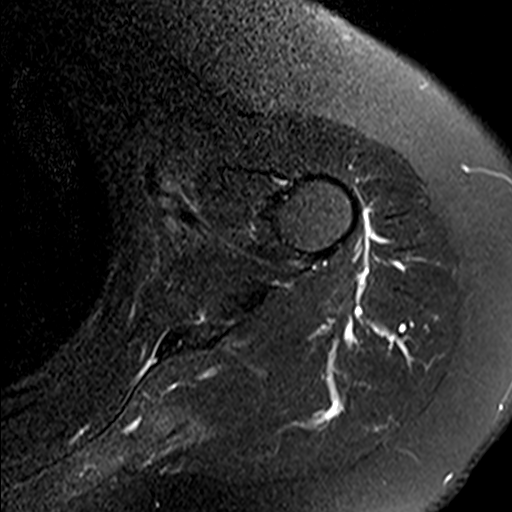
[im 17/30]
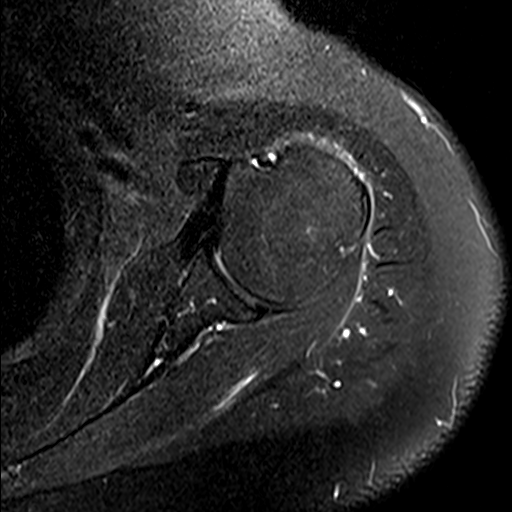
[im 20/30]
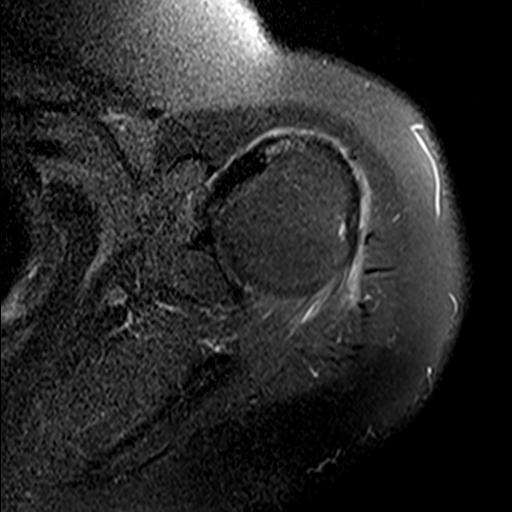
[im 26/30]
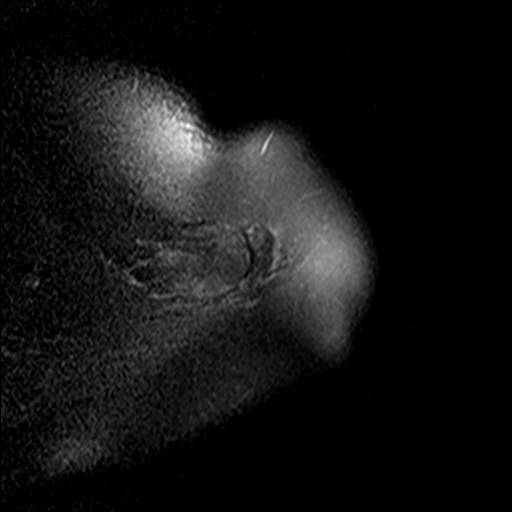
[im 30/30]
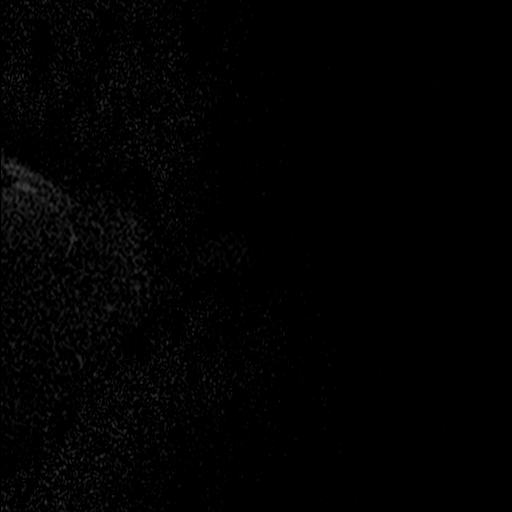

[Series 4: T2 fat-sat · oblique · 4.0mm · 0.59mm/px · 7 of 18 slices shown (2 of 3)]
[im 1/18]
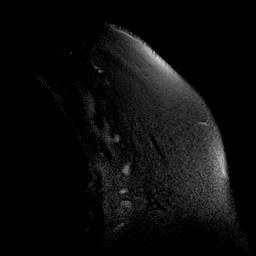
[im 3/18]
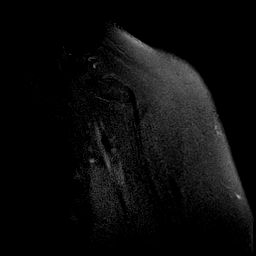
[im 6/18]
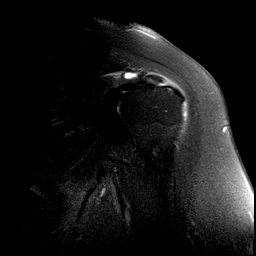
[im 9/18]
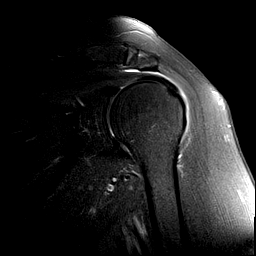
[im 12/18]
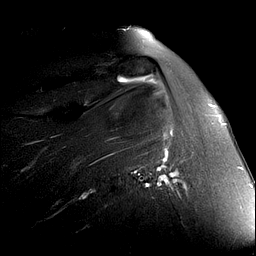
[im 15/18]
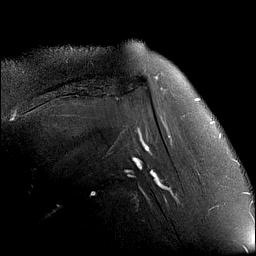
[im 18/18]
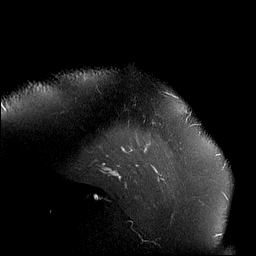

[Series 5: PD · oblique · 4.0mm · 0.59mm/px · 7 of 18 slices shown]
[im 1/18]
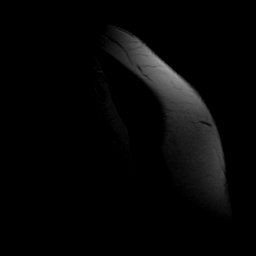
[im 3/18]
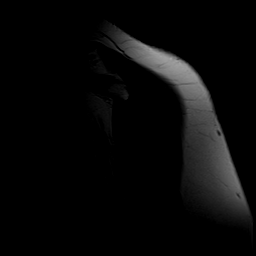
[im 6/18]
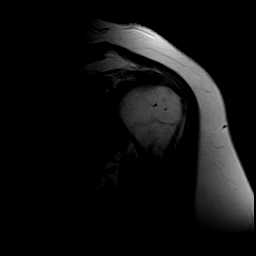
[im 9/18]
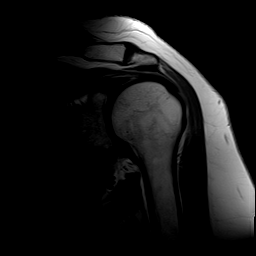
[im 12/18]
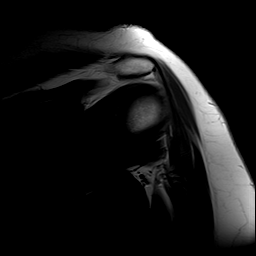
[im 15/18]
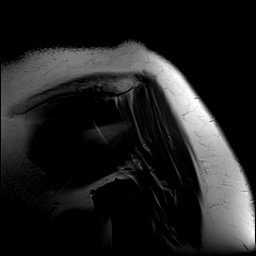
[im 18/18]
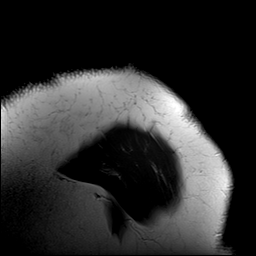

[Series 6: T1 · oblique · 4.0mm · 0.59mm/px · 8 of 20 slices shown]
[im 1/20]
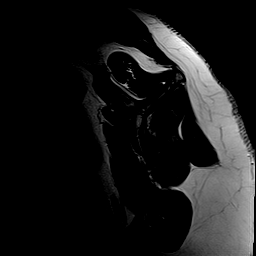
[im 3/20]
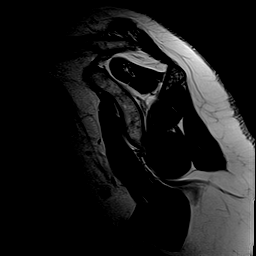
[im 6/20]
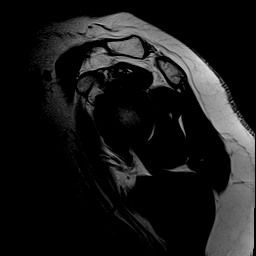
[im 9/20]
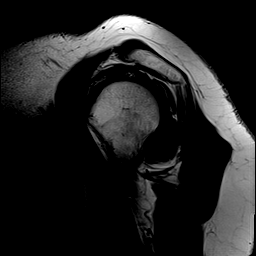
[im 11/20]
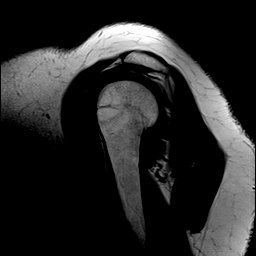
[im 14/20]
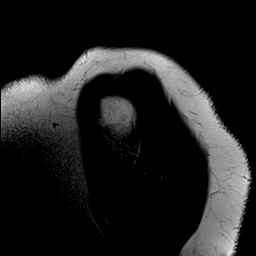
[im 17/20]
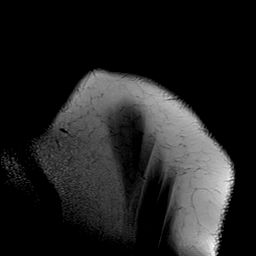
[im 20/20]
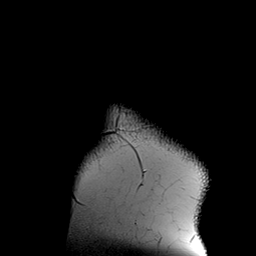

[Series 7: T2 fat-sat · oblique · 4.0mm · 0.59mm/px · 8 of 20 slices shown (3 of 3)]
[im 1/20]
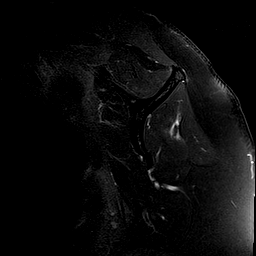
[im 3/20]
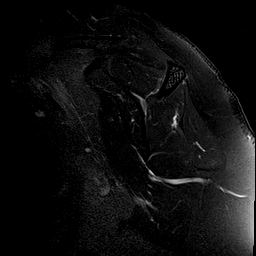
[im 6/20]
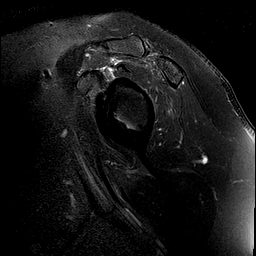
[im 9/20]
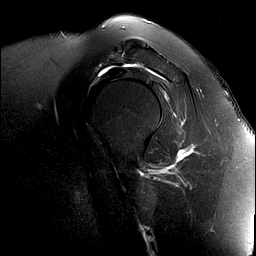
[im 11/20]
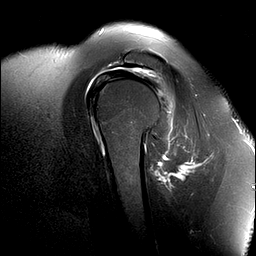
[im 14/20]
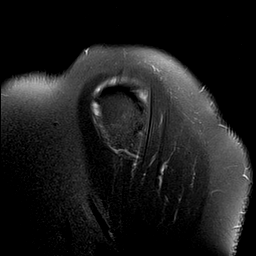
[im 17/20]
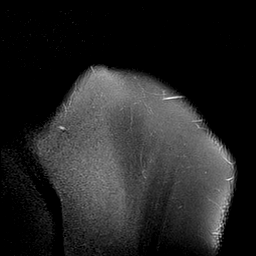
[im 20/20]
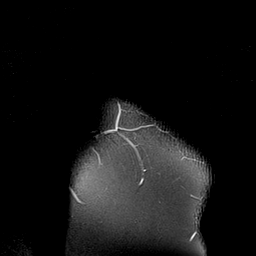

[39 of 40 positions shown; findings below may reference images not displayed]

FINDINGS: Despite efforts by the technologist and patient, mild motion
artifact is present on today's exam and could not be eliminated.
This reduces exam sensitivity and specificity.

Rotator cuff: There is a tear of the anterior leading edge of the
distal supraspinatus tendon which extends to the bursal surface.
This tear involves more than 50% of the tendon thickness and appears
nearly complete, although there is no tendon retraction. Mild
underlying supraspinatus and infraspinatus tendinosis. The
subscapularis and teres minor tendons appear normal.

Muscles: There is edema posteriorly along the superficial aspect of
the infraspinatus muscle which is suspicious for a partial
intramuscular tear. No focal muscular atrophy.

Biceps long head:  Intact and normally positioned.

Acromioclavicular Joint: The acromion is type 2. There are mild
acromioclavicular degenerative changes. There is a small to moderate
amount of fluid in the subacromial-subdeltoid bursa.

Glenohumeral Joint: No significant shoulder joint effusion or
glenohumeral arthropathy.

Labrum: Labral assessment limited by the lack of joint fluid. No
evidence of labral tear or paralabral cyst.

Bones: No acute or significant extra-articular osseous findings.

Other: No significant soft tissue findings.
IMPRESSION: 1. High-grade, nearly complete insertional tear of the anterior
leading edge of the supraspinatus tendon. No tendon retraction or
focal muscular atrophy.
2. Partial intramuscular tear involving the superficial fibers of
the infraspinatus muscle.
3. The labrum and biceps tendon appear intact.
4. Mild acromioclavicular degenerative changes with small to
moderate amount of fluid in the subacromial-subdeltoid bursa.

## 2022-11-18 DIAGNOSIS — J3081 Allergic rhinitis due to animal (cat) (dog) hair and dander: Secondary | ICD-10-CM | POA: Diagnosis not present

## 2022-11-18 DIAGNOSIS — J3089 Other allergic rhinitis: Secondary | ICD-10-CM | POA: Diagnosis not present

## 2022-11-18 DIAGNOSIS — J301 Allergic rhinitis due to pollen: Secondary | ICD-10-CM | POA: Diagnosis not present

## 2022-12-17 ENCOUNTER — Ambulatory Visit: Payer: Commercial Managed Care - PPO | Admitting: Nurse Practitioner

## 2023-01-27 ENCOUNTER — Other Ambulatory Visit: Payer: Self-pay

## 2023-01-27 ENCOUNTER — Other Ambulatory Visit (HOSPITAL_COMMUNITY): Payer: Self-pay

## 2023-01-27 DIAGNOSIS — M25552 Pain in left hip: Secondary | ICD-10-CM | POA: Diagnosis not present

## 2023-01-27 DIAGNOSIS — Z23 Encounter for immunization: Secondary | ICD-10-CM | POA: Diagnosis not present

## 2023-01-27 DIAGNOSIS — E7800 Pure hypercholesterolemia, unspecified: Secondary | ICD-10-CM | POA: Diagnosis not present

## 2023-01-27 DIAGNOSIS — Z0000 Encounter for general adult medical examination without abnormal findings: Secondary | ICD-10-CM | POA: Diagnosis not present

## 2023-01-27 DIAGNOSIS — G43109 Migraine with aura, not intractable, without status migrainosus: Secondary | ICD-10-CM | POA: Diagnosis not present

## 2023-01-27 DIAGNOSIS — F329 Major depressive disorder, single episode, unspecified: Secondary | ICD-10-CM | POA: Diagnosis not present

## 2023-01-27 DIAGNOSIS — Z6831 Body mass index (BMI) 31.0-31.9, adult: Secondary | ICD-10-CM | POA: Diagnosis not present

## 2023-01-27 MED ORDER — ZOLMITRIPTAN 5 MG PO TABS
5.0000 mg | ORAL_TABLET | Freq: Every day | ORAL | 2 refills | Status: AC
  Filled 2023-01-27: qty 17, 17d supply, fill #0
  Filled 2023-01-27: qty 1, 1d supply, fill #0
  Filled 2024-01-19: qty 12, 12d supply, fill #0

## 2023-01-27 MED ORDER — ROSUVASTATIN CALCIUM 10 MG PO TABS
10.0000 mg | ORAL_TABLET | Freq: Every day | ORAL | 1 refills | Status: DC
  Filled 2023-01-27: qty 90, 90d supply, fill #0
  Filled 2023-06-02: qty 90, 90d supply, fill #1

## 2023-01-27 MED ORDER — SERTRALINE HCL 100 MG PO TABS
150.0000 mg | ORAL_TABLET | Freq: Every day | ORAL | 2 refills | Status: DC
  Filled 2023-01-27 – 2023-03-10 (×2): qty 135, 90d supply, fill #0
  Filled 2023-06-15: qty 135, 90d supply, fill #1
  Filled 2023-09-15: qty 135, 90d supply, fill #2

## 2023-01-27 MED ORDER — TOPIRAMATE 50 MG PO TABS
50.0000 mg | ORAL_TABLET | Freq: Every day | ORAL | 3 refills | Status: DC
  Filled 2023-01-27: qty 83, 83d supply, fill #0
  Filled 2023-06-02: qty 83, 83d supply, fill #1
  Filled 2023-09-15: qty 83, 83d supply, fill #2
  Filled 2023-12-25: qty 83, 83d supply, fill #3

## 2023-01-28 ENCOUNTER — Other Ambulatory Visit (HOSPITAL_COMMUNITY): Payer: Self-pay

## 2023-02-01 ENCOUNTER — Other Ambulatory Visit (HOSPITAL_COMMUNITY): Payer: Self-pay

## 2023-02-01 DIAGNOSIS — M7062 Trochanteric bursitis, left hip: Secondary | ICD-10-CM | POA: Diagnosis not present

## 2023-02-02 ENCOUNTER — Other Ambulatory Visit (HOSPITAL_COMMUNITY): Payer: Self-pay

## 2023-02-04 ENCOUNTER — Other Ambulatory Visit (HOSPITAL_COMMUNITY): Payer: Self-pay

## 2023-03-03 DIAGNOSIS — M7062 Trochanteric bursitis, left hip: Secondary | ICD-10-CM | POA: Diagnosis not present

## 2023-03-10 ENCOUNTER — Encounter: Payer: Self-pay | Admitting: Nurse Practitioner

## 2023-03-10 ENCOUNTER — Other Ambulatory Visit (HOSPITAL_COMMUNITY): Payer: Self-pay

## 2023-03-10 ENCOUNTER — Ambulatory Visit (INDEPENDENT_AMBULATORY_CARE_PROVIDER_SITE_OTHER): Payer: Commercial Managed Care - PPO | Admitting: Nurse Practitioner

## 2023-03-10 VITALS — BP 112/72 | HR 91 | Ht 64.5 in | Wt 185.0 lb

## 2023-03-10 DIAGNOSIS — Z780 Asymptomatic menopausal state: Secondary | ICD-10-CM | POA: Diagnosis not present

## 2023-03-10 DIAGNOSIS — Z01419 Encounter for gynecological examination (general) (routine) without abnormal findings: Secondary | ICD-10-CM

## 2023-03-10 NOTE — Progress Notes (Signed)
   Susan Wagner 03/31/1966 [id number]   History:  57 y.o. G0 presents for annual exam without GYN complaints. Postmenopausal - no HRT, no bleeding. Normal pap and mammogram history. HTN, HLD, migraines, and anxiety/depression managed by PCP.   Gynecologic History Contraception: post menopausal status Sexually active: Yes  Health maintenance Last Pap: 11/24/2021. Results were: Normal neg HPV, 5-year repeat Last mammogram: 04/08/2022. Results were: Normal  Last colonoscopy: 10/2019. Results: Normal, 5-year recall Last Dexa: 12/16/2021. Results were: Normal  Past medical history, past surgical history, family history and social history were all reviewed and documented in the EPIC chart. 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   ROS:  A ROS was performed and pertinent positives and negatives are included.  Exam:  Vitals:   03/10/23 1335  BP: 112/72  Pulse: 91  SpO2: 98%  Weight: 185 lb (83.9 kg)  Height: 5' 4.5" (1.638 m)      Body mass index is 31.26 kg/m.  General appearance:  Normal Thyroid:  Symmetrical, normal in size, without palpable masses or nodularity. Respiratory  Auscultation:  Clear without wheezing or rhonchi Cardiovascular  Auscultation:  Tachycardia, regular, without rubs, murmurs or gallops  Edema/varicosities:  Not grossly evident Abdominal  Soft,nontender, without masses, guarding or rebound.  Liver/spleen:  No organomegaly noted  Hernia:  None appreciated  Skin  Inspection:  Grossly normal   Breasts: Examined lying and sitting.   Right: Without masses, retractions, discharge or axillary adenopathy.   Left: Without masses, retractions, discharge or axillary adenopathy. Pelvic: External genitalia:  no lesions              Urethra:  normal appearing urethra with no masses, tenderness or lesions              Bartholins and Skenes: normal                 Vagina: normal appearing vagina with normal color and discharge, no lesions. Atrophic changes               Cervix: no lesions Bimanual Exam:  Uterus:  no masses or tenderness              Adnexa: no mass, fullness, tenderness              Rectovaginal: Deferred              Anus:  normal, no lesions  Patient informed chaperone available to be present for breast and pelvic exam. Patient has requested no chaperone to be present. Patient has been advised what will be completed during breast and pelvic exam.   Assessment/Plan:  57 y.o. G0 for annual exam.    Well female exam with routine gynecological exam - Education provided on SBEs, importance of preventative screenings, current guidelines, high calcium diet, regular exercise, and multivitamin daily. Labs with PCP.   Postmenopausal - No HRT, no bleeding.   Screening for cervical cancer - Normal Pap history. Will repeat at 5-year interval per guidelines.   Screening for breast cancer - Normal mammogram history.  Continue annual screenings.  Normal breast exam today.  Screening for colon cancer - 10/2019 colonoscopy. Will repeat at GI's recommended interval.   Return in about 1 year (around 03/09/2024) for Annual.    Susan Wagner [facility], 1:56 PM 03/10/2023

## 2023-04-06 ENCOUNTER — Inpatient Hospital Stay (HOSPITAL_BASED_OUTPATIENT_CLINIC_OR_DEPARTMENT_OTHER)
Admission: RE | Admit: 2023-04-06 | Payer: Commercial Managed Care - PPO | Source: Ambulatory Visit | Admitting: Radiology

## 2023-04-06 DIAGNOSIS — Z1231 Encounter for screening mammogram for malignant neoplasm of breast: Secondary | ICD-10-CM

## 2023-04-13 ENCOUNTER — Encounter (HOSPITAL_BASED_OUTPATIENT_CLINIC_OR_DEPARTMENT_OTHER): Payer: Self-pay | Admitting: Radiology

## 2023-04-13 ENCOUNTER — Ambulatory Visit (HOSPITAL_BASED_OUTPATIENT_CLINIC_OR_DEPARTMENT_OTHER)
Admission: RE | Admit: 2023-04-13 | Discharge: 2023-04-13 | Disposition: A | Discharge status: Home / Self Care | Payer: Commercial Managed Care - PPO | Source: Ambulatory Visit

## 2023-04-13 DIAGNOSIS — Z1231 Encounter for screening mammogram for malignant neoplasm of breast: Secondary | ICD-10-CM | POA: Diagnosis not present

## 2023-04-29 DIAGNOSIS — Z800 Family history of malignant neoplasm of digestive organs: Secondary | ICD-10-CM | POA: Diagnosis not present

## 2023-04-29 DIAGNOSIS — L578 Other skin changes due to chronic exposure to nonionizing radiation: Secondary | ICD-10-CM | POA: Diagnosis not present

## 2023-04-29 DIAGNOSIS — Z86018 Personal history of other benign neoplasm: Secondary | ICD-10-CM | POA: Diagnosis not present

## 2023-04-29 DIAGNOSIS — D225 Melanocytic nevi of trunk: Secondary | ICD-10-CM | POA: Diagnosis not present

## 2023-04-29 DIAGNOSIS — D2272 Melanocytic nevi of left lower limb, including hip: Secondary | ICD-10-CM | POA: Diagnosis not present

## 2023-04-29 DIAGNOSIS — L821 Other seborrheic keratosis: Secondary | ICD-10-CM | POA: Diagnosis not present

## 2023-04-29 DIAGNOSIS — Z808 Family history of malignant neoplasm of other organs or systems: Secondary | ICD-10-CM | POA: Diagnosis not present

## 2023-04-29 DIAGNOSIS — B353 Tinea pedis: Secondary | ICD-10-CM | POA: Diagnosis not present

## 2023-06-18 ENCOUNTER — Other Ambulatory Visit (HOSPITAL_COMMUNITY): Payer: Self-pay

## 2023-07-07 ENCOUNTER — Other Ambulatory Visit (HOSPITAL_COMMUNITY): Payer: Self-pay

## 2023-07-07 DIAGNOSIS — J3089 Other allergic rhinitis: Secondary | ICD-10-CM | POA: Diagnosis not present

## 2023-07-07 DIAGNOSIS — J301 Allergic rhinitis due to pollen: Secondary | ICD-10-CM | POA: Diagnosis not present

## 2023-07-07 DIAGNOSIS — J4520 Mild intermittent asthma, uncomplicated: Secondary | ICD-10-CM | POA: Diagnosis not present

## 2023-07-07 DIAGNOSIS — J3081 Allergic rhinitis due to animal (cat) (dog) hair and dander: Secondary | ICD-10-CM | POA: Diagnosis not present

## 2023-07-07 MED ORDER — LEVOCETIRIZINE DIHYDROCHLORIDE 5 MG PO TABS
5.0000 mg | ORAL_TABLET | Freq: Every evening | ORAL | 2 refills | Status: AC
  Filled 2023-07-07 – 2024-01-19 (×2): qty 90, 90d supply, fill #0

## 2023-07-07 MED ORDER — EPINEPHRINE 0.3 MG/0.3ML IJ SOAJ
INTRAMUSCULAR | 1 refills | Status: AC
  Filled 2023-07-07: qty 2, 30d supply, fill #0

## 2023-07-13 ENCOUNTER — Other Ambulatory Visit (HOSPITAL_COMMUNITY): Payer: Self-pay

## 2023-07-14 DIAGNOSIS — J3081 Allergic rhinitis due to animal (cat) (dog) hair and dander: Secondary | ICD-10-CM | POA: Diagnosis not present

## 2023-07-14 DIAGNOSIS — J3089 Other allergic rhinitis: Secondary | ICD-10-CM | POA: Diagnosis not present

## 2023-07-14 DIAGNOSIS — J301 Allergic rhinitis due to pollen: Secondary | ICD-10-CM | POA: Diagnosis not present

## 2023-07-23 DIAGNOSIS — J3081 Allergic rhinitis due to animal (cat) (dog) hair and dander: Secondary | ICD-10-CM | POA: Diagnosis not present

## 2023-07-23 DIAGNOSIS — J3089 Other allergic rhinitis: Secondary | ICD-10-CM | POA: Diagnosis not present

## 2023-07-23 DIAGNOSIS — J301 Allergic rhinitis due to pollen: Secondary | ICD-10-CM | POA: Diagnosis not present

## 2023-08-10 DIAGNOSIS — J3089 Other allergic rhinitis: Secondary | ICD-10-CM | POA: Diagnosis not present

## 2023-08-10 DIAGNOSIS — J301 Allergic rhinitis due to pollen: Secondary | ICD-10-CM | POA: Diagnosis not present

## 2023-08-10 DIAGNOSIS — J3081 Allergic rhinitis due to animal (cat) (dog) hair and dander: Secondary | ICD-10-CM | POA: Diagnosis not present

## 2023-08-12 DIAGNOSIS — J3081 Allergic rhinitis due to animal (cat) (dog) hair and dander: Secondary | ICD-10-CM | POA: Diagnosis not present

## 2023-08-12 DIAGNOSIS — J3089 Other allergic rhinitis: Secondary | ICD-10-CM | POA: Diagnosis not present

## 2023-08-12 DIAGNOSIS — J301 Allergic rhinitis due to pollen: Secondary | ICD-10-CM | POA: Diagnosis not present

## 2023-08-20 DIAGNOSIS — J301 Allergic rhinitis due to pollen: Secondary | ICD-10-CM | POA: Diagnosis not present

## 2023-08-20 DIAGNOSIS — J3089 Other allergic rhinitis: Secondary | ICD-10-CM | POA: Diagnosis not present

## 2023-08-20 DIAGNOSIS — J3081 Allergic rhinitis due to animal (cat) (dog) hair and dander: Secondary | ICD-10-CM | POA: Diagnosis not present

## 2023-08-25 DIAGNOSIS — J3081 Allergic rhinitis due to animal (cat) (dog) hair and dander: Secondary | ICD-10-CM | POA: Diagnosis not present

## 2023-08-25 DIAGNOSIS — J3089 Other allergic rhinitis: Secondary | ICD-10-CM | POA: Diagnosis not present

## 2023-08-25 DIAGNOSIS — J301 Allergic rhinitis due to pollen: Secondary | ICD-10-CM | POA: Diagnosis not present

## 2023-09-03 DIAGNOSIS — J3089 Other allergic rhinitis: Secondary | ICD-10-CM | POA: Diagnosis not present

## 2023-09-03 DIAGNOSIS — J3081 Allergic rhinitis due to animal (cat) (dog) hair and dander: Secondary | ICD-10-CM | POA: Diagnosis not present

## 2023-09-03 DIAGNOSIS — J301 Allergic rhinitis due to pollen: Secondary | ICD-10-CM | POA: Diagnosis not present

## 2023-09-10 DIAGNOSIS — J301 Allergic rhinitis due to pollen: Secondary | ICD-10-CM | POA: Diagnosis not present

## 2023-09-10 DIAGNOSIS — J3081 Allergic rhinitis due to animal (cat) (dog) hair and dander: Secondary | ICD-10-CM | POA: Diagnosis not present

## 2023-09-10 DIAGNOSIS — J3089 Other allergic rhinitis: Secondary | ICD-10-CM | POA: Diagnosis not present

## 2023-09-15 ENCOUNTER — Other Ambulatory Visit (HOSPITAL_COMMUNITY): Payer: Self-pay

## 2023-09-15 MED ORDER — ROSUVASTATIN CALCIUM 10 MG PO TABS
10.0000 mg | ORAL_TABLET | Freq: Every day | ORAL | 1 refills | Status: DC
  Filled 2023-09-15: qty 90, 90d supply, fill #0
  Filled 2023-12-25: qty 90, 90d supply, fill #1

## 2023-09-16 ENCOUNTER — Other Ambulatory Visit (HOSPITAL_COMMUNITY): Payer: Self-pay

## 2023-09-23 DIAGNOSIS — J3089 Other allergic rhinitis: Secondary | ICD-10-CM | POA: Diagnosis not present

## 2023-09-23 DIAGNOSIS — J301 Allergic rhinitis due to pollen: Secondary | ICD-10-CM | POA: Diagnosis not present

## 2023-09-23 DIAGNOSIS — J3081 Allergic rhinitis due to animal (cat) (dog) hair and dander: Secondary | ICD-10-CM | POA: Diagnosis not present

## 2023-09-28 DIAGNOSIS — J3089 Other allergic rhinitis: Secondary | ICD-10-CM | POA: Diagnosis not present

## 2023-09-28 DIAGNOSIS — J3081 Allergic rhinitis due to animal (cat) (dog) hair and dander: Secondary | ICD-10-CM | POA: Diagnosis not present

## 2023-09-28 DIAGNOSIS — J301 Allergic rhinitis due to pollen: Secondary | ICD-10-CM | POA: Diagnosis not present

## 2023-10-08 DIAGNOSIS — J3089 Other allergic rhinitis: Secondary | ICD-10-CM | POA: Diagnosis not present

## 2023-10-08 DIAGNOSIS — J3081 Allergic rhinitis due to animal (cat) (dog) hair and dander: Secondary | ICD-10-CM | POA: Diagnosis not present

## 2023-10-08 DIAGNOSIS — J301 Allergic rhinitis due to pollen: Secondary | ICD-10-CM | POA: Diagnosis not present

## 2023-10-10 ENCOUNTER — Encounter

## 2023-11-05 DIAGNOSIS — J3089 Other allergic rhinitis: Secondary | ICD-10-CM | POA: Diagnosis not present

## 2023-11-05 DIAGNOSIS — J301 Allergic rhinitis due to pollen: Secondary | ICD-10-CM | POA: Diagnosis not present

## 2023-11-05 DIAGNOSIS — J3081 Allergic rhinitis due to animal (cat) (dog) hair and dander: Secondary | ICD-10-CM | POA: Diagnosis not present

## 2023-11-11 DIAGNOSIS — J3081 Allergic rhinitis due to animal (cat) (dog) hair and dander: Secondary | ICD-10-CM | POA: Diagnosis not present

## 2023-11-11 DIAGNOSIS — J301 Allergic rhinitis due to pollen: Secondary | ICD-10-CM | POA: Diagnosis not present

## 2023-11-11 DIAGNOSIS — J3089 Other allergic rhinitis: Secondary | ICD-10-CM | POA: Diagnosis not present

## 2023-11-18 DIAGNOSIS — J3081 Allergic rhinitis due to animal (cat) (dog) hair and dander: Secondary | ICD-10-CM | POA: Diagnosis not present

## 2023-11-18 DIAGNOSIS — J301 Allergic rhinitis due to pollen: Secondary | ICD-10-CM | POA: Diagnosis not present

## 2023-11-18 DIAGNOSIS — J3089 Other allergic rhinitis: Secondary | ICD-10-CM | POA: Diagnosis not present

## 2023-12-03 DIAGNOSIS — J301 Allergic rhinitis due to pollen: Secondary | ICD-10-CM | POA: Diagnosis not present

## 2023-12-03 DIAGNOSIS — J3081 Allergic rhinitis due to animal (cat) (dog) hair and dander: Secondary | ICD-10-CM | POA: Diagnosis not present

## 2023-12-03 DIAGNOSIS — J3089 Other allergic rhinitis: Secondary | ICD-10-CM | POA: Diagnosis not present

## 2023-12-25 ENCOUNTER — Other Ambulatory Visit (HOSPITAL_COMMUNITY): Payer: Self-pay

## 2023-12-27 ENCOUNTER — Other Ambulatory Visit (HOSPITAL_COMMUNITY): Payer: Self-pay

## 2023-12-27 MED ORDER — SERTRALINE HCL 100 MG PO TABS
150.0000 mg | ORAL_TABLET | Freq: Every day | ORAL | 1 refills | Status: AC
  Filled 2023-12-27: qty 135, 90d supply, fill #0
  Filled 2024-03-24: qty 135, 90d supply, fill #1

## 2023-12-29 DIAGNOSIS — J3089 Other allergic rhinitis: Secondary | ICD-10-CM | POA: Diagnosis not present

## 2023-12-29 DIAGNOSIS — J3081 Allergic rhinitis due to animal (cat) (dog) hair and dander: Secondary | ICD-10-CM | POA: Diagnosis not present

## 2023-12-29 DIAGNOSIS — J301 Allergic rhinitis due to pollen: Secondary | ICD-10-CM | POA: Diagnosis not present

## 2024-01-05 DIAGNOSIS — J3089 Other allergic rhinitis: Secondary | ICD-10-CM | POA: Diagnosis not present

## 2024-01-05 DIAGNOSIS — J3081 Allergic rhinitis due to animal (cat) (dog) hair and dander: Secondary | ICD-10-CM | POA: Diagnosis not present

## 2024-01-05 DIAGNOSIS — J301 Allergic rhinitis due to pollen: Secondary | ICD-10-CM | POA: Diagnosis not present

## 2024-01-13 DIAGNOSIS — J3081 Allergic rhinitis due to animal (cat) (dog) hair and dander: Secondary | ICD-10-CM | POA: Diagnosis not present

## 2024-01-13 DIAGNOSIS — J301 Allergic rhinitis due to pollen: Secondary | ICD-10-CM | POA: Diagnosis not present

## 2024-01-13 DIAGNOSIS — J3089 Other allergic rhinitis: Secondary | ICD-10-CM | POA: Diagnosis not present

## 2024-01-19 ENCOUNTER — Encounter (HOSPITAL_COMMUNITY): Payer: Self-pay

## 2024-01-19 ENCOUNTER — Encounter (HOSPITAL_COMMUNITY): Payer: Self-pay | Admitting: Pharmacist

## 2024-01-19 ENCOUNTER — Other Ambulatory Visit (HOSPITAL_COMMUNITY): Payer: Self-pay

## 2024-01-20 ENCOUNTER — Other Ambulatory Visit: Payer: Self-pay

## 2024-01-21 DIAGNOSIS — J3089 Other allergic rhinitis: Secondary | ICD-10-CM | POA: Diagnosis not present

## 2024-01-21 DIAGNOSIS — J301 Allergic rhinitis due to pollen: Secondary | ICD-10-CM | POA: Diagnosis not present

## 2024-01-21 DIAGNOSIS — J3081 Allergic rhinitis due to animal (cat) (dog) hair and dander: Secondary | ICD-10-CM | POA: Diagnosis not present

## 2024-01-25 ENCOUNTER — Other Ambulatory Visit: Payer: Self-pay

## 2024-01-27 DIAGNOSIS — J301 Allergic rhinitis due to pollen: Secondary | ICD-10-CM | POA: Diagnosis not present

## 2024-01-27 DIAGNOSIS — J3081 Allergic rhinitis due to animal (cat) (dog) hair and dander: Secondary | ICD-10-CM | POA: Diagnosis not present

## 2024-01-27 DIAGNOSIS — J3089 Other allergic rhinitis: Secondary | ICD-10-CM | POA: Diagnosis not present

## 2024-02-03 ENCOUNTER — Other Ambulatory Visit (HOSPITAL_COMMUNITY): Payer: Self-pay

## 2024-02-03 DIAGNOSIS — J3089 Other allergic rhinitis: Secondary | ICD-10-CM | POA: Diagnosis not present

## 2024-02-03 DIAGNOSIS — F329 Major depressive disorder, single episode, unspecified: Secondary | ICD-10-CM | POA: Diagnosis not present

## 2024-02-03 DIAGNOSIS — J3081 Allergic rhinitis due to animal (cat) (dog) hair and dander: Secondary | ICD-10-CM | POA: Diagnosis not present

## 2024-02-03 DIAGNOSIS — E782 Mixed hyperlipidemia: Secondary | ICD-10-CM | POA: Diagnosis not present

## 2024-02-03 DIAGNOSIS — Z0000 Encounter for general adult medical examination without abnormal findings: Secondary | ICD-10-CM | POA: Diagnosis not present

## 2024-02-03 DIAGNOSIS — E559 Vitamin D deficiency, unspecified: Secondary | ICD-10-CM | POA: Diagnosis not present

## 2024-02-03 DIAGNOSIS — J301 Allergic rhinitis due to pollen: Secondary | ICD-10-CM | POA: Diagnosis not present

## 2024-02-03 DIAGNOSIS — Z1389 Encounter for screening for other disorder: Secondary | ICD-10-CM | POA: Diagnosis not present

## 2024-02-03 DIAGNOSIS — G43109 Migraine with aura, not intractable, without status migrainosus: Secondary | ICD-10-CM | POA: Diagnosis not present

## 2024-02-03 DIAGNOSIS — Z23 Encounter for immunization: Secondary | ICD-10-CM | POA: Diagnosis not present

## 2024-02-03 MED ORDER — TOPIRAMATE 50 MG PO TABS
50.0000 mg | ORAL_TABLET | Freq: Every day | ORAL | 3 refills | Status: AC
  Filled 2024-02-03 – 2024-03-24 (×2): qty 90, 90d supply, fill #0

## 2024-02-03 MED ORDER — SERTRALINE HCL 100 MG PO TABS: 150.0000 mg | ORAL_TABLET | Freq: Every day | ORAL | 1 refills | Status: AC

## 2024-02-03 MED ORDER — ROSUVASTATIN CALCIUM 10 MG PO TABS
10.0000 mg | ORAL_TABLET | Freq: Every day | ORAL | 1 refills | Status: AC
  Filled 2024-03-24: qty 90, 90d supply, fill #0

## 2024-02-10 DIAGNOSIS — J301 Allergic rhinitis due to pollen: Secondary | ICD-10-CM | POA: Diagnosis not present

## 2024-02-10 DIAGNOSIS — J3081 Allergic rhinitis due to animal (cat) (dog) hair and dander: Secondary | ICD-10-CM | POA: Diagnosis not present

## 2024-02-10 DIAGNOSIS — J3089 Other allergic rhinitis: Secondary | ICD-10-CM | POA: Diagnosis not present

## 2024-02-17 DIAGNOSIS — J3081 Allergic rhinitis due to animal (cat) (dog) hair and dander: Secondary | ICD-10-CM | POA: Diagnosis not present

## 2024-02-17 DIAGNOSIS — J301 Allergic rhinitis due to pollen: Secondary | ICD-10-CM | POA: Diagnosis not present

## 2024-02-17 DIAGNOSIS — J3089 Other allergic rhinitis: Secondary | ICD-10-CM | POA: Diagnosis not present

## 2024-03-01 DIAGNOSIS — J301 Allergic rhinitis due to pollen: Secondary | ICD-10-CM | POA: Diagnosis not present

## 2024-03-01 DIAGNOSIS — J3081 Allergic rhinitis due to animal (cat) (dog) hair and dander: Secondary | ICD-10-CM | POA: Diagnosis not present

## 2024-03-01 DIAGNOSIS — J3089 Other allergic rhinitis: Secondary | ICD-10-CM | POA: Diagnosis not present

## 2024-03-15 ENCOUNTER — Ambulatory Visit (INDEPENDENT_AMBULATORY_CARE_PROVIDER_SITE_OTHER): Admitting: Nurse Practitioner

## 2024-03-15 ENCOUNTER — Encounter: Payer: Self-pay | Admitting: Nurse Practitioner

## 2024-03-15 VITALS — BP 120/78 | HR 80 | Resp 16 | Ht 63.25 in | Wt 178.0 lb

## 2024-03-15 DIAGNOSIS — Z1331 Encounter for screening for depression: Secondary | ICD-10-CM

## 2024-03-15 DIAGNOSIS — Z780 Asymptomatic menopausal state: Secondary | ICD-10-CM | POA: Diagnosis not present

## 2024-03-15 DIAGNOSIS — Z01419 Encounter for gynecological examination (general) (routine) without abnormal findings: Secondary | ICD-10-CM | POA: Diagnosis not present

## 2024-03-15 NOTE — Progress Notes (Signed)
   KINDRED HEYING 07-01-1965 [id number]   History:  58 y.o. G0 presents for annual exam without GYN complaints. Postmenopausal - no HRT, no bleeding. Normal pap history. HTN, HLD, migraines, and anxiety/depression managed by PCP.   Gynecologic History Contraception: post menopausal status Sexually active: Yes  Health maintenance Last Pap: 11/24/2021. Results were: Normal neg HPV Last mammogram: 04/13/2023. Results were: Normal  Last colonoscopy: 10/2019. Results: Normal, 5-year recall Last Dexa: 12/16/2021. Results were: Normal     03/15/2024    3:16 PM  Depression screen PHQ 2/9  Decreased Interest 0  Down, Depressed, Hopeless 0  PHQ - 2 Score 0     Past medical history, past surgical history, family history and social history were all reviewed and documented in the EPIC chart. 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   ROS:  A ROS was performed and pertinent positives and negatives are included.  Exam:  Vitals:   03/15/24 1510  BP: 120/78  Pulse: 80  Resp: 16  Weight: 178 lb (80.7 kg)  Height: 5' 3.25 (1.607 m)       Body mass index is 31.28 kg/m.  General appearance:  Normal Thyroid:  Symmetrical, normal in size, without palpable masses or nodularity. Respiratory  Auscultation:  Clear without wheezing or rhonchi Cardiovascular  Auscultation:  Tachycardia, regular, without rubs, murmurs or gallops  Edema/varicosities:  Not grossly evident Abdominal  Soft,nontender, without masses, guarding or rebound.  Liver/spleen:  No organomegaly noted  Hernia:  None appreciated  Skin  Inspection:  Grossly normal   Breasts: Examined lying and sitting.   Right: Without masses, retractions, discharge or axillary adenopathy.   Left: Without masses, retractions, discharge or axillary adenopathy. Pelvic: External genitalia:  no lesions              Urethra:  normal appearing urethra with no masses, tenderness or lesions              Bartholins and Skenes: normal                  Vagina: normal appearing vagina with normal color and discharge, no lesions. Atrophic changes              Cervix: no lesions Bimanual Exam:  Uterus:  no masses or tenderness              Adnexa: no mass, fullness, tenderness              Rectovaginal: Deferred              Anus:  normal, no lesions  Zada Louder, CMA present as chaperone.   Assessment/Plan:  58 y.o. G0 for annual exam.    Well female exam with routine gynecological exam - Education provided on SBEs, importance of preventative screenings, current guidelines, high calcium  diet, regular exercise, and multivitamin daily. Labs with PCP.   Postmenopausal - No HRT, no bleeding.   Screening for cervical cancer - Normal Pap history. Will repeat at 5-year interval per guidelines.   Screening for breast cancer - Normal mammogram history.  Continue annual screenings.  Normal breast exam today.  Screening for colon cancer - 10/2019 colonoscopy. Will repeat at GI's recommended interval.   Return in about 1 year (around 03/15/2025) for Annual.    Annabella DELENA Shutter [facility], 3:33 PM 03/15/2024

## 2024-03-16 DIAGNOSIS — J301 Allergic rhinitis due to pollen: Secondary | ICD-10-CM | POA: Diagnosis not present

## 2024-03-16 DIAGNOSIS — J3081 Allergic rhinitis due to animal (cat) (dog) hair and dander: Secondary | ICD-10-CM | POA: Diagnosis not present

## 2024-03-16 DIAGNOSIS — J3089 Other allergic rhinitis: Secondary | ICD-10-CM | POA: Diagnosis not present

## 2024-03-24 DIAGNOSIS — J3081 Allergic rhinitis due to animal (cat) (dog) hair and dander: Secondary | ICD-10-CM | POA: Diagnosis not present

## 2024-03-24 DIAGNOSIS — J3089 Other allergic rhinitis: Secondary | ICD-10-CM | POA: Diagnosis not present

## 2024-03-24 DIAGNOSIS — J301 Allergic rhinitis due to pollen: Secondary | ICD-10-CM | POA: Diagnosis not present

## 2024-03-25 ENCOUNTER — Other Ambulatory Visit (HOSPITAL_COMMUNITY): Payer: Self-pay

## 2024-03-31 DIAGNOSIS — J101 Influenza due to other identified influenza virus with other respiratory manifestations: Secondary | ICD-10-CM | POA: Diagnosis not present

## 2024-03-31 DIAGNOSIS — R051 Acute cough: Secondary | ICD-10-CM | POA: Diagnosis not present

## 2024-03-31 DIAGNOSIS — J029 Acute pharyngitis, unspecified: Secondary | ICD-10-CM | POA: Diagnosis not present

## 2024-03-31 DIAGNOSIS — Z03818 Encounter for observation for suspected exposure to other biological agents ruled out: Secondary | ICD-10-CM | POA: Diagnosis not present

## 2024-04-13 DIAGNOSIS — J301 Allergic rhinitis due to pollen: Secondary | ICD-10-CM | POA: Diagnosis not present

## 2024-04-13 DIAGNOSIS — J3089 Other allergic rhinitis: Secondary | ICD-10-CM | POA: Diagnosis not present

## 2024-04-13 DIAGNOSIS — J3081 Allergic rhinitis due to animal (cat) (dog) hair and dander: Secondary | ICD-10-CM | POA: Diagnosis not present

## 2024-04-20 DIAGNOSIS — J3081 Allergic rhinitis due to animal (cat) (dog) hair and dander: Secondary | ICD-10-CM | POA: Diagnosis not present

## 2024-04-20 DIAGNOSIS — J301 Allergic rhinitis due to pollen: Secondary | ICD-10-CM | POA: Diagnosis not present

## 2024-04-20 DIAGNOSIS — J3089 Other allergic rhinitis: Secondary | ICD-10-CM | POA: Diagnosis not present

## 2024-04-25 ENCOUNTER — Other Ambulatory Visit (HOSPITAL_BASED_OUTPATIENT_CLINIC_OR_DEPARTMENT_OTHER): Payer: Self-pay | Admitting: Family Medicine

## 2024-04-25 ENCOUNTER — Encounter (HOSPITAL_BASED_OUTPATIENT_CLINIC_OR_DEPARTMENT_OTHER): Payer: Self-pay | Admitting: Radiology

## 2024-04-25 ENCOUNTER — Inpatient Hospital Stay (HOSPITAL_BASED_OUTPATIENT_CLINIC_OR_DEPARTMENT_OTHER): Admission: RE | Admit: 2024-04-25 | Discharge: 2024-04-25

## 2024-04-25 DIAGNOSIS — Z1231 Encounter for screening mammogram for malignant neoplasm of breast: Secondary | ICD-10-CM | POA: Diagnosis not present

## 2024-05-02 DIAGNOSIS — J3089 Other allergic rhinitis: Secondary | ICD-10-CM | POA: Diagnosis not present

## 2024-05-02 DIAGNOSIS — J3081 Allergic rhinitis due to animal (cat) (dog) hair and dander: Secondary | ICD-10-CM | POA: Diagnosis not present

## 2024-05-02 DIAGNOSIS — J301 Allergic rhinitis due to pollen: Secondary | ICD-10-CM | POA: Diagnosis not present
